# Patient Record
Sex: Female | Born: 1960 | Marital: Married | Smoking: Former smoker
Health system: Southern US, Community
[De-identification: ages and names within clinical notes are randomized; demographics above are authoritative.]

## PROBLEM LIST (undated history)

## (undated) DIAGNOSIS — E785 Hyperlipidemia, unspecified: Secondary | ICD-10-CM

## (undated) DIAGNOSIS — Z87442 Personal history of urinary calculi: Secondary | ICD-10-CM

## (undated) DIAGNOSIS — I10 Essential (primary) hypertension: Secondary | ICD-10-CM

## (undated) DIAGNOSIS — Z8679 Personal history of other diseases of the circulatory system: Secondary | ICD-10-CM

## (undated) DIAGNOSIS — R161 Splenomegaly, not elsewhere classified: Secondary | ICD-10-CM

## (undated) DIAGNOSIS — C4490 Unspecified malignant neoplasm of skin, unspecified: Secondary | ICD-10-CM

## (undated) DIAGNOSIS — E119 Type 2 diabetes mellitus without complications: Secondary | ICD-10-CM

## (undated) DIAGNOSIS — Z8669 Personal history of other diseases of the nervous system and sense organs: Secondary | ICD-10-CM

## (undated) HISTORY — DX: Splenomegaly, not elsewhere classified: R16.1

## (undated) HISTORY — PX: UPPER GASTROINTESTINAL ENDOSCOPY: SHX188

## (undated) HISTORY — PX: TUBAL LIGATION: SHX77

## (undated) HISTORY — DX: Type 2 diabetes mellitus without complications: E11.9

## (undated) HISTORY — PX: COLONOSCOPY: SHX174

## (undated) HISTORY — DX: Personal history of urinary calculi: Z87.442

## (undated) HISTORY — DX: Essential (primary) hypertension: I10

## (undated) HISTORY — PX: BREAST DUCTAL SYSTEM EXCISION: SHX5242

## (undated) HISTORY — PX: APPENDECTOMY: SHX54

## (undated) HISTORY — PX: SQUAMOUS CELL CARCINOMA EXCISION: SHX2433

## (undated) HISTORY — DX: Personal history of other diseases of the circulatory system: Z86.79

## (undated) HISTORY — DX: Personal history of other diseases of the nervous system and sense organs: Z86.69

## (undated) HISTORY — DX: Hyperlipidemia, unspecified: E78.5

## (undated) HISTORY — DX: Unspecified malignant neoplasm of skin, unspecified: C44.90

---

## 2013-10-31 DIAGNOSIS — I10 Essential (primary) hypertension: Secondary | ICD-10-CM | POA: Insufficient documentation

## 2020-08-02 ENCOUNTER — Telehealth: Payer: Self-pay | Admitting: Hematology and Oncology

## 2020-08-02 NOTE — Telephone Encounter (Signed)
Patient referred by Dr Ernestene Kiel for Neutropena/Thrombocyptopenia.  Appt made for 08/07/20 Consult 9:00 am - Labs 10:30 am (if needed)

## 2020-08-05 ENCOUNTER — Telehealth: Payer: Self-pay | Admitting: Oncology

## 2020-08-05 NOTE — Telephone Encounter (Signed)
Due to Dr Alvy Bimler being called to another location on 6/22, patient has been rescheduled to 6/27 Labs 1:45 pm - Consult w/Dr Bobby Rumpf 2:15 pm

## 2020-08-07 ENCOUNTER — Encounter: Payer: Self-pay | Admitting: Hematology and Oncology

## 2020-08-07 ENCOUNTER — Other Ambulatory Visit: Payer: Self-pay

## 2020-08-12 ENCOUNTER — Other Ambulatory Visit: Payer: Self-pay

## 2020-08-12 ENCOUNTER — Other Ambulatory Visit: Payer: Self-pay | Admitting: Oncology

## 2020-08-12 ENCOUNTER — Telehealth: Payer: Self-pay | Admitting: Oncology

## 2020-08-12 ENCOUNTER — Encounter: Payer: Self-pay | Admitting: Oncology

## 2020-08-12 ENCOUNTER — Inpatient Hospital Stay: Payer: Self-pay | Attending: Hematology and Oncology | Admitting: Oncology

## 2020-08-12 ENCOUNTER — Inpatient Hospital Stay: Payer: Self-pay

## 2020-08-12 DIAGNOSIS — Z79899 Other long term (current) drug therapy: Secondary | ICD-10-CM | POA: Insufficient documentation

## 2020-08-12 DIAGNOSIS — Z8249 Family history of ischemic heart disease and other diseases of the circulatory system: Secondary | ICD-10-CM

## 2020-08-12 DIAGNOSIS — M2559 Pain in other specified joint: Secondary | ICD-10-CM

## 2020-08-12 DIAGNOSIS — Z833 Family history of diabetes mellitus: Secondary | ICD-10-CM

## 2020-08-12 DIAGNOSIS — D72819 Decreased white blood cell count, unspecified: Secondary | ICD-10-CM | POA: Insufficient documentation

## 2020-08-12 DIAGNOSIS — R5383 Other fatigue: Secondary | ICD-10-CM

## 2020-08-12 DIAGNOSIS — Z806 Family history of leukemia: Secondary | ICD-10-CM | POA: Insufficient documentation

## 2020-08-12 DIAGNOSIS — R519 Headache, unspecified: Secondary | ICD-10-CM

## 2020-08-12 DIAGNOSIS — D696 Thrombocytopenia, unspecified: Secondary | ICD-10-CM | POA: Insufficient documentation

## 2020-08-12 DIAGNOSIS — R7401 Elevation of levels of liver transaminase levels: Secondary | ICD-10-CM | POA: Insufficient documentation

## 2020-08-12 DIAGNOSIS — R0602 Shortness of breath: Secondary | ICD-10-CM

## 2020-08-12 DIAGNOSIS — Z818 Family history of other mental and behavioral disorders: Secondary | ICD-10-CM

## 2020-08-12 DIAGNOSIS — Z8349 Family history of other endocrine, nutritional and metabolic diseases: Secondary | ICD-10-CM

## 2020-08-12 LAB — CBC AND DIFFERENTIAL
HCT: 41 (ref 36–46)
Hemoglobin: 13.8 (ref 12.0–16.0)
Neutrophils Absolute: 1.37
Platelets: 58 — AB (ref 150–399)
WBC: 2.4

## 2020-08-12 LAB — FOLATE: Folate: 11 ng/mL (ref >5.9)

## 2020-08-12 LAB — IRON AND TIBC
Iron: 115 ug/dL (ref 28–170)
Saturation Ratios: 29 % (ref 10.4–31.8)
TIBC: 392 ug/dL (ref 250–450)
UIBC: 277 ug/dL

## 2020-08-12 LAB — TSH: TSH: 1.018 u[IU]/mL (ref 0.350–4.500)

## 2020-08-12 LAB — VITAMIN B12: Vitamin B-12: 385 pg/mL (ref 180–914)

## 2020-08-12 LAB — FERRITIN: Ferritin: 32 ng/mL (ref 11–307)

## 2020-08-12 LAB — HEPATIC FUNCTION PANEL
ALT: 35 (ref 7–35)
AST: 45 — AB (ref 13–35)
Alkaline Phosphatase: 94 (ref 25–125)
Bilirubin, Total: 1.3

## 2020-08-12 LAB — COMPREHENSIVE METABOLIC PANEL
Albumin: 3.9 (ref 3.5–5.0)
Calcium: 8.7 (ref 8.7–10.7)

## 2020-08-12 LAB — CBC: RBC: 4.36 (ref 3.87–5.11)

## 2020-08-12 LAB — BASIC METABOLIC PANEL
BUN: 15 (ref 4–21)
CO2: 27 — AB (ref 13–22)
Chloride: 106 (ref 99–108)
Creatinine: 0.9 (ref 0.5–1.1)
Glucose: 102
Potassium: 4.1 (ref 3.4–5.3)
Sodium: 139 (ref 137–147)

## 2020-08-12 NOTE — Telephone Encounter (Signed)
Per 6/27 los next appt scheduled and given to patient.

## 2020-08-12 NOTE — Progress Notes (Signed)
[facility]  [address] [place],  [place]  [address] [phone]  Clinic Day:  08/12/2020  Referring physician: Ernestene Kiel, MD   HISTORY OF PRESENT ILLNESS:  The patient is a 60 y.o. female who I was asked to consult upon for neutropenia.  Recent labs showed a low white count of 1.9.  Her CBC also showed a low platelet count of 64.  These labs were checked in the [facility] emergency room after she presented there with supraventricular tachycardia.  When her CBC was rechecked at her primary care office a week later, her white cells and platelets remained low.  She recalls having night sweats a week and a half ago.  She denies having other B symptoms over these past months.  According to the patient, she never had been told before of having low blood counts.  Her cytopenias have her concerned as she had a half brother die from leukemia at age 19.  She also had a maternal aunt pass away from leukemia.   PAST MEDICAL HISTORY:   Past Medical History:  Diagnosis Date   Diabetes mellitus without complication (HCC)    History of Bell's palsy    History of PSVT (paroxysmal supraventricular tachycardia)    History of renal calculi    Hyperlipidemia    Hypertension    Skin cancer     PAST SURGICAL HISTORY:  Appendectomy, bilateral tubal ligation  CURRENT MEDICATIONS:   Current Outpatient Medications  Medication Sig Dispense Refill   albuterol (VENTOLIN HFA) 108 (90 Base) MCG/ACT inhaler SMARTSIG:1-2 Puff(s) By Mouth Every 4 Hours PRN     gabapentin (NEURONTIN) 100 MG capsule Take 100 mg by mouth 3 (three) times daily as needed.     latanoprost (XALATAN) 0.005 % ophthalmic solution 1 drop at bedtime.     losartan (COZAAR) 100 MG tablet Take 100 mg by mouth daily.     metFORMIN (GLUCOPHAGE-XR) 500 MG 24 hr tablet Take 1,000 mg by mouth daily.     metoprolol succinate (TOPROL-XL) 25 MG 24 hr tablet Take 25 mg by mouth daily.     telmisartan (MICARDIS)  80 MG tablet Take 80 mg by mouth daily.     traZODone (DESYREL) 50 MG tablet Take 50-100 mg by mouth at bedtime as needed.     No current facility-administered medications for this visit.    ALLERGIES:   Allergies  Allergen Reactions   Amlodipine    Benicar [Olmesartan]    Indapamide    Lisinopril     FAMILY HISTORY:   Family History  Problem Relation Age of Onset   Hypertension Mother    Congestive Heart Failure Mother    Chronic Renal Failure Mother    Congestive Heart Failure Father    Diabetes Father    Thyroid disease Father    Leukemia Half-Brother    Down syndrome Half-Brother    Thyroid disease Half-Sister    Leukemia Maternal Aunt     SOCIAL HISTORY:  The patient was born and raised in [place].  She lives in [place] with her husband of 80 years.  She has 3 children and 11 grandchildren.  She was a Lobbyist for 21 years.  She denies a history of alcohol use.  She smoked less than a pack of cigarettes daily for 6 years she has not smoked in 34 years.    REVIEW OF SYSTEMS:  Review of Systems  Constitutional:  Positive for fatigue. Negative for fever.  HENT:   Negative for hearing loss and sore throat.   Eyes:  Negative for eye problems.  Respiratory:  Positive for shortness of breath. Negative for chest tightness, cough and hemoptysis.   Cardiovascular:  Positive for palpitations. Negative for chest pain.  Gastrointestinal:  Negative for abdominal distention, abdominal pain, blood in stool, constipation, diarrhea, nausea and vomiting.  Endocrine: Negative for hot flashes.  Genitourinary:  Negative for difficulty urinating, dysuria, frequency, hematuria and nocturia.   Musculoskeletal:  Positive for arthralgias. Negative for back pain, gait problem and myalgias.  Skin: Negative.  Negative for itching and rash.       Easy bruising  Neurological:  Positive for headaches. Negative for dizziness, extremity weakness, gait problem,  light-headedness and numbness.  Hematological: Negative.   Psychiatric/Behavioral: Negative.  Negative for depression and suicidal ideas. The patient is not nervous/anxious.     PHYSICAL EXAM:  Blood pressure (!) 228/96, pulse (!) 52, temperature 98.4 F (36.9 C), resp. rate 16, height 5' 3.5" (1.613 m), weight 182 lb 14.4 oz (83 kg), SpO2 96 %. Wt Readings from Last 3 Encounters:  08/12/20 182 lb 14.4 oz (83 kg)   Body mass index is 31.89 kg/m. Performance status (ECOG): 1 - Symptomatic but completely ambulatory Physical Exam Constitutional:      Appearance: Normal appearance. She is not ill-appearing.  HENT:     Mouth/Throat:     Mouth: Mucous membranes are moist.     Pharynx: Oropharynx is clear. No oropharyngeal exudate or posterior oropharyngeal erythema.  Cardiovascular:     Rate and Rhythm: Normal rate and regular rhythm.     Heart sounds: No murmur heard.   No friction rub. No gallop.  Pulmonary:     Effort: Pulmonary effort is normal. No respiratory distress.     Breath sounds: Normal breath sounds. No wheezing, rhonchi or rales.  Chest:  Breasts:    Right: No axillary adenopathy or supraclavicular adenopathy.     Left: No axillary adenopathy or supraclavicular adenopathy.  Abdominal:     General: Bowel sounds are normal. There is no distension.     Palpations: Abdomen is soft. There is no mass.     Tenderness: There is no abdominal tenderness.  Musculoskeletal:        General: No swelling.     Right lower leg: No edema.     Left lower leg: No edema.  Lymphadenopathy:     Cervical: No cervical adenopathy.     Upper Body:     Right upper body: No supraclavicular or axillary adenopathy.     Left upper body: No supraclavicular or axillary adenopathy.     Lower Body: No right inguinal adenopathy. No left inguinal adenopathy.  Skin:    General: Skin is warm.     Coloration: Skin is not jaundiced.     Findings: No lesion or rash.  Neurological:     General: No  focal deficit present.     Mental Status: She is alert and oriented to person, place, and time. Mental status is at baseline.     Cranial Nerves: Cranial nerves are intact.  Psychiatric:        Mood and Affect: Mood normal.        Behavior: Behavior normal.        Thought Content: Thought content normal.   LABS:    CMP Latest Ref Rng & Units 08/12/2020  BUN 4 - 21 15  Creatinine 0.5 - 1.1 0.9  Sodium 137 -  147 139  Potassium 3.4 - 5.3 4.1  Chloride 99 - 108 106  CO2 13 - 22 27(A)  Calcium 8.7 - 10.7 8.7  Alkaline Phos 25 - 125 94  AST 13 - 35 45(A)  ALT 7 - 35 35    ASSESSMENT & PLAN:  A 60 y.o. female who I was asked to consult upon for leukopenia and thrombocytopenia.   In clinic today, I will check her iron, B12 and folate levels to ensure her cytopenias are not due to any nutritional deficiencies.  Her TSH will be checked to ensure severe thyroid disease is not factoring into her cytopenias.  It is not uncommon to see cytopenias in the setting of liver disease.  As she does have an elevated AST level, I will have her undergo a CT scan of her abdomen/pelvis to ensure no underlying hepatosplenic disease is present.  I will see her back in 3 weeks to go over her labs and scans, as well as their implications as it pertains to her cytopenias.  The patient understands all the plans discussed today and is in agreement with them  I do appreciate Ernestene Kiel, MD for his new consult.   Almee Pelphrey Macarthur Critchley, MD

## 2020-08-12 NOTE — Telephone Encounter (Signed)
Per 6/27 los next appt scheduled and given to patient

## 2020-08-13 NOTE — Progress Notes (Signed)
Patient will not have insurance until August of this year. She was denied [place]'s financial assistance months back, after checking with her and Samantha Crimes financial counselor she still would not be eligible. She is ok with getting her scans now and stated that she would pay on them the best she could.

## 2020-08-21 DIAGNOSIS — D72819 Decreased white blood cell count, unspecified: Secondary | ICD-10-CM | POA: Insufficient documentation

## 2020-08-21 DIAGNOSIS — D696 Thrombocytopenia, unspecified: Secondary | ICD-10-CM | POA: Insufficient documentation

## 2020-08-29 NOTE — Progress Notes (Signed)
[facility]  [address] [place],  [place]  [address] [phone]  Clinic Day:  09/03/2020  Referring physician: Ernestene Kiel, MD  This document serves as a record of services personally performed by Marice Potter, MD. It was created on their behalf by [facility] E, a trained medical scribe. The creation of this record is based on the scribe's personal observations and the provider's statements to them.  HISTORY OF PRESENT ILLNESS:  The patient is a 60 y.o. female who I recently began seeing for neutropenia and thrombocytopenia.  As recent labs also showed elevated liver enzymes, the patient recently underwent CT scan for further evaluation.  She comes in today to go over CT scan images and their implications.  Since her last visit, the patient has been doing fairly well.  She denies having any spontaneous infections which concern her for worsening neutropenia.  She also denies having any spontaneous bleeding/ bruising issues which concern her for worsening thrombocytopenia.  PHYSICAL EXAM:  Blood pressure (!) 183/81, pulse 68, temperature 98.3 F (36.8 C), temperature source Oral, resp. rate 18, height 5' 3.5" (1.613 m), weight 180 lb 1.6 oz (81.7 kg), SpO2 95 %. Wt Readings from Last 3 Encounters:  09/03/20 180 lb 1.6 oz (81.7 kg)  08/12/20 182 lb 14.4 oz (83 kg)   Body mass index is 31.4 kg/m. Performance status (ECOG): 1 - Symptomatic but completely ambulatory Physical Exam Constitutional:      Appearance: Normal appearance. She is not ill-appearing.  HENT:     Mouth/Throat:     Mouth: Mucous membranes are moist.     Pharynx: Oropharynx is clear. No oropharyngeal exudate or posterior oropharyngeal erythema.  Cardiovascular:     Rate and Rhythm: Normal rate and regular rhythm.     Heart sounds: No murmur heard.   No friction rub. No gallop.  Pulmonary:     Effort: Pulmonary effort is normal. No respiratory distress.     Breath  sounds: Normal breath sounds. No wheezing, rhonchi or rales.  Chest:  Breasts:    Right: No axillary adenopathy or supraclavicular adenopathy.     Left: No axillary adenopathy or supraclavicular adenopathy.  Abdominal:     General: Bowel sounds are normal. There is no distension.     Palpations: Abdomen is soft. There is no mass.     Tenderness: There is no abdominal tenderness.  Musculoskeletal:        General: No swelling.     Right lower leg: No edema.     Left lower leg: No edema.  Lymphadenopathy:     Cervical: No cervical adenopathy.     Upper Body:     Right upper body: No supraclavicular or axillary adenopathy.     Left upper body: No supraclavicular or axillary adenopathy.     Lower Body: No right inguinal adenopathy. No left inguinal adenopathy.  Skin:    General: Skin is warm.     Coloration: Skin is not jaundiced.     Findings: No lesion or rash.  Neurological:     General: No focal deficit present.     Mental Status: She is alert and oriented to person, place, and time. Mental status is at baseline.     Cranial Nerves: Cranial nerves are intact.  Psychiatric:        Mood and Affect: Mood normal.        Behavior: Behavior normal.        Thought Content: Thought content  normal.   SCANS:  CT abdomen and pelvis from 09/02/2020 revealed the following: FINDINGS: Lower Chest: No acute findings.  Hepatobiliary: Hepatic cirrhosis is demonstrated. Recanalization of paraumbilical veins is consistent with portal venous hypertension. No hepatic masses are identified. Gallbladder is unremarkable. No evidence of biliary ductal dilatation.  Pancreas: A multilocular cystic lesion is seen in the pancreatic uncinate process which measures 2.0 x 1.9 cm on image 44/601. A 9 mm cystic lesion is seen in the pancreatic tail on image 33/2. No evidence of pancreatic ductal dilatation.  Spleen: Marked splenomegaly is demonstrated with length of approximately 19 cm. No splenic masses  identified.  Adrenals/Urinary Tract: No masses identified. No evidence of ureteral calculi or hydronephrosis.  Stomach/Bowel: No evidence of obstruction, inflammatory process or abnormal fluid collections. A 1.8 cm soft tissue density seen along the posterior wall of the ascending colon on image 52/2, suspicious for a colonic polyp.  Vascular/Lymphatic: No pathologically enlarged lymph nodes. No acute vascular findings. Esophageal varices are seen, consistent with portal venous hypertension.  Reproductive: 1.7 cm right-sided uterine fibroid noted. Adnexal regions are unremarkable.  Other:  No evidence of ascites.  Musculoskeletal:  No suspicious bone lesions identified.  IMPRESSION: Hepatic cirrhosis and findings of portal venous hypertension including esophageal varices. No evidence of hepatic neoplasm.  Marked splenomegaly. No evidence of ascites.  Small uterine fibroid.  Cystic lesions in the pancreatic uncinate process and tail, largest measuring 2.0 cm. Recommend abdomen MRI without and with contrast for further characterization.  1.8 cm soft tissue density along the posterior wall of the ascending colon, suspicious for colonic polyp. Colonoscopy should be considered for further evaluation.   LABS:    CMP Latest Ref Rng & Units 08/12/2020  BUN 4 - 21 15  Creatinine 0.5 - 1.1 0.9  Sodium 137 - 147 139  Potassium 3.4 - 5.3 4.1  Chloride 99 - 108 106  CO2 13 - 22 27(A)  Calcium 8.7 - 10.7 8.7  Alkaline Phos 25 - 125 94  AST 13 - 35 45(A)  ALT 7 - 35 35    ASSESSMENT & PLAN:  A 60 y.o. female with leukopenia and thrombocytopenia.   In clinic today, I went over all of her CT scan images with her, for which she could see that she does have cirrhosis, as well as secondary splenomegaly.  The patient knew about her history of fatty liver disease.  This is likely the reason behind her cirrhosis.  Her secondary splenomegaly is likely the major reason behind her  cytopenias.  I also showed the patient images that showed a 2 pancreatic lesions, including 1 at the head and another at the tail of her pancreas.  Her scans also showed a lesion in her right colon, which may be a polyp.  Based upon these findings and her baseline liver disease, I will have her see gastroenterology in [place] to undergo both an ERCP and a colonoscopy, as well as to evaluate her cirrhosis.  I will see her back in 6 weeks for repeat clinical assessment.  Hopefully, by then, her GI workup will have been completed to where additional plans can be formulated.     The patient understands all the plans discussed today and is in agreement with them  I, Rita Ohara, am acting as scribe for Marice Potter, MD    I have reviewed this report as typed by the medical scribe, and it is complete and accurate.  Makara Lanzo Macarthur Critchley, MD

## 2020-09-03 ENCOUNTER — Inpatient Hospital Stay: Payer: Self-pay | Attending: Hematology and Oncology | Admitting: Oncology

## 2020-09-03 ENCOUNTER — Telehealth: Payer: Self-pay | Admitting: Oncology

## 2020-09-03 ENCOUNTER — Other Ambulatory Visit: Payer: Self-pay | Admitting: Oncology

## 2020-09-03 ENCOUNTER — Other Ambulatory Visit: Payer: Self-pay

## 2020-09-03 VITALS — BP 183/81 | HR 68 | Temp 98.3°F | Resp 18 | Ht 63.5 in | Wt 180.1 lb

## 2020-09-03 DIAGNOSIS — D72819 Decreased white blood cell count, unspecified: Secondary | ICD-10-CM

## 2020-09-03 DIAGNOSIS — D696 Thrombocytopenia, unspecified: Secondary | ICD-10-CM

## 2020-09-03 DIAGNOSIS — D708 Other neutropenia: Secondary | ICD-10-CM

## 2020-09-03 NOTE — Telephone Encounter (Signed)
Per 7/19 los next appt scheduled and confirmed by patient

## 2020-09-10 ENCOUNTER — Encounter: Payer: Self-pay | Admitting: Internal Medicine

## 2020-09-11 ENCOUNTER — Encounter: Payer: Self-pay | Admitting: Oncology

## 2020-09-24 ENCOUNTER — Telehealth: Payer: Self-pay | Admitting: Oncology

## 2020-09-24 NOTE — Telephone Encounter (Signed)
Per 8/8 Staff Msg, patient rescheduled to 10/25/20 Labs 9:00 am - Follow Up 9:30 am.  Patient is aware of the Appt Change

## 2020-10-15 ENCOUNTER — Other Ambulatory Visit: Payer: Self-pay

## 2020-10-15 ENCOUNTER — Ambulatory Visit: Payer: Self-pay | Admitting: Oncology

## 2020-10-16 ENCOUNTER — Other Ambulatory Visit (INDEPENDENT_AMBULATORY_CARE_PROVIDER_SITE_OTHER): Payer: Self-pay

## 2020-10-16 ENCOUNTER — Ambulatory Visit (INDEPENDENT_AMBULATORY_CARE_PROVIDER_SITE_OTHER): Payer: Self-pay | Admitting: Internal Medicine

## 2020-10-16 ENCOUNTER — Encounter: Payer: Self-pay | Admitting: Internal Medicine

## 2020-10-16 VITALS — BP 132/76 | HR 84 | Ht 64.0 in | Wt 178.0 lb

## 2020-10-16 DIAGNOSIS — K7460 Unspecified cirrhosis of liver: Secondary | ICD-10-CM

## 2020-10-16 DIAGNOSIS — R935 Abnormal findings on diagnostic imaging of other abdominal regions, including retroperitoneum: Secondary | ICD-10-CM

## 2020-10-16 DIAGNOSIS — R7989 Other specified abnormal findings of blood chemistry: Secondary | ICD-10-CM

## 2020-10-16 DIAGNOSIS — R945 Abnormal results of liver function studies: Secondary | ICD-10-CM

## 2020-10-16 DIAGNOSIS — K862 Cyst of pancreas: Secondary | ICD-10-CM

## 2020-10-16 LAB — CBC WITH DIFFERENTIAL/PLATELET
Basophils Absolute: 0 10*3/uL (ref 0.0–0.1)
Basophils Relative: 0.9 % (ref 0.0–3.0)
Eosinophils Absolute: 0 10*3/uL (ref 0.0–0.7)
Eosinophils Relative: 2.3 % (ref 0.0–5.0)
HCT: 41.4 % (ref 36.0–46.0)
Hemoglobin: 13.7 g/dL (ref 12.0–15.0)
Lymphocytes Relative: 31.9 % (ref 12.0–46.0)
Lymphs Abs: 0.6 10*3/uL — ABNORMAL LOW (ref 0.7–4.0)
MCHC: 33.1 g/dL (ref 30.0–36.0)
MCV: 93.9 fl (ref 78.0–100.0)
Monocytes Absolute: 0.2 10*3/uL (ref 0.1–1.0)
Monocytes Relative: 8.1 % (ref 3.0–12.0)
Neutro Abs: 1.1 10*3/uL — ABNORMAL LOW (ref 1.4–7.7)
Neutrophils Relative %: 56.8 % (ref 43.0–77.0)
Platelets: 48 10*3/uL — CL (ref 150.0–400.0)
RBC: 4.41 Mil/uL (ref 3.87–5.11)
RDW: 14.7 % (ref 11.5–15.5)
WBC: 1.9 10*3/uL — CL (ref 4.0–10.5)

## 2020-10-16 LAB — BASIC METABOLIC PANEL
BUN: 16 mg/dL (ref 6–23)
CO2: 24 mEq/L (ref 19–32)
Calcium: 9.3 mg/dL (ref 8.4–10.5)
Chloride: 107 mEq/L (ref 96–112)
Creatinine, Ser: 0.96 mg/dL (ref 0.40–1.20)
GFR: 63.62 mL/min (ref >60.00)
Glucose, Bld: 110 mg/dL — ABNORMAL HIGH (ref 70–99)
Potassium: 4.3 mEq/L (ref 3.5–5.1)
Sodium: 139 mEq/L (ref 135–145)

## 2020-10-16 LAB — HEPATIC FUNCTION PANEL
ALT: 45 U/L — ABNORMAL HIGH (ref 0–35)
AST: 43 U/L — ABNORMAL HIGH (ref 0–37)
Albumin: 3.7 g/dL (ref 3.5–5.2)
Alkaline Phosphatase: 80 U/L (ref 39–117)
Bilirubin, Direct: 0.1 mg/dL (ref 0.0–0.3)
Total Bilirubin: 0.7 mg/dL (ref 0.2–1.2)
Total Protein: 7 g/dL (ref 6.0–8.3)

## 2020-10-16 LAB — PROTIME-INR
INR: 1.2 ratio — ABNORMAL HIGH (ref 0.8–1.0)
Prothrombin Time: 12.6 s (ref 9.6–13.1)

## 2020-10-16 LAB — TSH: TSH: 1.77 u[IU]/mL (ref 0.35–5.50)

## 2020-10-16 MED ORDER — SUTAB 1479-225-188 MG PO TABS: 1.0000 | ORAL_TABLET | Freq: Once | ORAL | 0 refills | Status: AC

## 2020-10-16 NOTE — Patient Instructions (Addendum)
If you are age 62 or older, your body mass index should be between 23-30. Your Body mass index is 30.55 kg/m. If this is out of the aforementioned range listed, please consider follow up with your Primary Care Provider.  If you are age 22 or younger, your body mass index should be between 19-25. Your Body mass index is 30.55 kg/m. If this is out of the aformentioned range listed, please consider follow up with your Primary Care Provider.   __________________________________________________________  The [facility] providers would like to encourage you to use [facility] to communicate with providers for non-urgent requests or questions.  Due to long hold times on the telephone, sending your provider a message by [facility] may be a faster and more efficient way to get a response.  Please allow 48 business hours for a response.  Please remember that this is for non-urgent requests.   You have been scheduled for an endoscopy and colonoscopy. Please follow the written instructions given to you at your visit today. Please pick up your prep supplies at the pharmacy within the next 1-3 days. If you use inhalers (even only as needed), please bring them with you on the day of your procedure.   Your provider has requested that you go to the basement level for lab work before leaving today. Press "B" on the elevator. The lab is located at the first door on the left as you exit the elevator.

## 2020-10-16 NOTE — Progress Notes (Signed)
HISTORY OF PRESENT ILLNESS:  Sheri Ramirez is a 60 y.o. female, retired Music therapist, with a history of diabetes mellitus, hypertension, and PSVT who was sent today by her primary care provider regarding newly diagnosed hepatic cirrhosis and abnormal findings on CT imaging.  Patient is accompanied today by her husband.  Patient tells me that she was in her usual state of health until June 2022 when she presented to her local hospital in [place] with SVT.  She was successfully treated with adenosine.  At that time blood work revealed several abnormalities including thrombocytopenia with a platelet count of 58,000.  White blood cell count 2.4.  Liver tests were normal except for AST of 45.  She was subsequently referred to a hematologist, Sheri Ramirez.  As part of that evaluation she underwent CT imaging.  She was found to have hepatic cirrhosis with portal hypertension eluding esophageal varices.  No evidence for hepatic neoplasm.  There was marked splenomegaly.  No ascites.  There are also cystic lesions in the pancreatic uncinate process and tail, the largest of which measured 2 cm.  For this MRI was recommended.  Finally, a 1.8 cm soft tissue density along the posterior wall of the ascending colon which was suspicious for a colonic polyp.  Colonoscopy recommended.  Patient denies a personal history of liver disease.  She denies prior transfusion, tattoos, or IV drugs.  She does not use alcohol.  She has been chronically overweight at about her current BMI of 30.  Her father had cirrhosis secondary to hepatitis from a blood transfusion.  Her granddaughter had a liver transplant due to biliary atresia.  The patient herself had a mild to moderate COVID infection in January 2022.  She has been diabetic since 2015.  Her son has Crohn's disease.  REVIEW OF SYSTEMS:  All non-GI ROS negative unless otherwise stated in the HPI except for back pain  Past Medical History:  Diagnosis Date   Diabetes  mellitus without complication ([place])    History of Bell's palsy    History of PSVT (paroxysmal supraventricular tachycardia)    History of renal calculi    Hyperlipidemia    Hypertension    Skin cancer    Splenomegaly     Past Surgical History:  Procedure Laterality Date   APPENDECTOMY     BREAST DUCTAL SYSTEM EXCISION     SQUAMOUS CELL CARCINOMA EXCISION     nose   TUBAL LIGATION Bilateral     Social History Sheri Ramirez  reports that she has quit smoking. Her smoking use included cigarettes. She has never used smokeless tobacco. She reports that she does not currently use alcohol. She reports that she does not currently use drugs.  family history includes Chronic Renal Failure in her mother; Congestive Heart Failure in her father and mother; Crohn's disease in her child; Diabetes in her father; Down syndrome in her half-brother; Hypertension in her mother; Leukemia in her half-brother and maternal aunt; Thyroid disease in her father and half-sister.  Allergies  Allergen Reactions   Amlodipine    Benicar [Olmesartan]    Indapamide    Lisinopril        PHYSICAL EXAMINATION: Vital signs: BP 132/76   Pulse 84   Ht '5\' 4"'$  (1.626 m)   Wt 178 lb (80.7 kg)   BMI 30.55 kg/m   Constitutional: generally well-appearing, no acute distress Psychiatric: alert and oriented x3, cooperative Eyes: extraocular movements intact, anicteric, conjunctiva pink Mouth: oral pharynx moist, no lesions.  Normal tongue hue Neck: supple no lymphadenopathy Cardiovascular: heart regular rate and rhythm, no murmur Lungs: clear to auscultation bilaterally Abdomen: soft, nontender, nondistended, no obvious ascites, no peritoneal signs, normal bowel sounds, palpable spleen tip Rectal: Deferred until colonoscopy Extremities: no clubbing, cyanosis, or lower extremity edema bilaterally Skin: no lesions on visible extremities Neuro: No focal deficits. No asterixis.     ASSESSMENT:  1.  Hepatic  cirrhosis with portal hypertension.  Etiology uncertain.  Compensated. 2.  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  Possible colonic polyp on CT as described 4.  General medical problems   PLAN:  1.  Multiple laboratories today to assess for infectious and noninfectious causes for hepatic cirrhosis. 2.  Schedule upper endoscopy to rule out esophageal varices.  The patient is high risk given her comorbidities.The nature of the procedure, as well as the risks, benefits, and alternatives were carefully and thoroughly reviewed with the patient. Ample time for discussion and questions allowed. The patient understood, was satisfied, and agreed to proceed.  3.  Schedule colonoscopy to clarify abnormality on CT and provide colon cancer screening.  The patient is high risk given her comorbidities.The nature of the procedure, as well as the risks, benefits, and alternatives were carefully and thoroughly reviewed with the patient. Ample time for discussion and questions allowed. The patient understood, was satisfied, and agreed to proceed.  4.  MRI versus EUS at some point regarding pancreatic cystic lesion.  We will confer with EUS colleagues at some point after all of the above completed. 5.  Hold diabetic medications the day of the procedure to avoid unwanted hypoglycemia A total time of 60 minutes was spent preparing to see the patient, reviewing laboratories and x-rays, obtaining comprehensive history and performing comprehensive physical examination.  Counseling the patient and her husband regarding the above listed issues.  Ordering medications and advanced endoscopic procedures.  Finally, documenting clinical information in the health record

## 2020-10-18 LAB — HEPATITIS B SURFACE ANTIBODY,QUALITATIVE: Hep B S Ab: NONREACTIVE

## 2020-10-18 LAB — ANA: Anti Nuclear Antibody (ANA): NEGATIVE

## 2020-10-18 LAB — ALPHA-1-ANTITRYPSIN: A-1 Antitrypsin, Ser: 153 mg/dL (ref 83–199)

## 2020-10-18 LAB — HEPATITIS C ANTIBODY
Hepatitis C Ab: NONREACTIVE
SIGNAL TO CUT-OFF: 0.02 (ref <1.00)

## 2020-10-18 LAB — HEPATITIS B SURFACE ANTIGEN: Hepatitis B Surface Ag: NONREACTIVE

## 2020-10-18 LAB — ANTI-SMOOTH MUSCLE ANTIBODY, IGG: Actin (Smooth Muscle) Antibody (IGG): <20 U (ref <20)

## 2020-10-18 LAB — CERULOPLASMIN: Ceruloplasmin: 23 mg/dL (ref 18–53)

## 2020-10-18 LAB — MITOCHONDRIAL ANTIBODIES: Mitochondrial M2 Ab, IgG: <=20 U

## 2020-10-22 ENCOUNTER — Other Ambulatory Visit: Payer: Self-pay

## 2020-10-25 ENCOUNTER — Inpatient Hospital Stay: Payer: Self-pay | Attending: Hematology and Oncology

## 2020-10-25 ENCOUNTER — Inpatient Hospital Stay: Payer: Self-pay | Admitting: Oncology

## 2020-10-25 ENCOUNTER — Ambulatory Visit (INDEPENDENT_AMBULATORY_CARE_PROVIDER_SITE_OTHER): Payer: Self-pay | Admitting: Internal Medicine

## 2020-10-25 DIAGNOSIS — Z23 Encounter for immunization: Secondary | ICD-10-CM

## 2020-11-29 ENCOUNTER — Ambulatory Visit (INDEPENDENT_AMBULATORY_CARE_PROVIDER_SITE_OTHER): Payer: Self-pay | Admitting: Internal Medicine

## 2020-11-29 DIAGNOSIS — Z23 Encounter for immunization: Secondary | ICD-10-CM

## 2020-12-23 ENCOUNTER — Encounter: Payer: Self-pay | Admitting: Internal Medicine

## 2020-12-23 ENCOUNTER — Ambulatory Visit (AMBULATORY_SURGERY_CENTER): Payer: Self-pay | Admitting: Internal Medicine

## 2020-12-23 ENCOUNTER — Other Ambulatory Visit: Payer: Self-pay

## 2020-12-23 VITALS — BP 162/72 | HR 52 | Temp 97.7°F | Resp 20 | Ht 64.0 in | Wt 178.0 lb

## 2020-12-23 DIAGNOSIS — K7460 Unspecified cirrhosis of liver: Secondary | ICD-10-CM

## 2020-12-23 DIAGNOSIS — K3189 Other diseases of stomach and duodenum: Secondary | ICD-10-CM

## 2020-12-23 DIAGNOSIS — D124 Benign neoplasm of descending colon: Secondary | ICD-10-CM

## 2020-12-23 DIAGNOSIS — I8500 Esophageal varices without bleeding: Secondary | ICD-10-CM

## 2020-12-23 DIAGNOSIS — D122 Benign neoplasm of ascending colon: Secondary | ICD-10-CM

## 2020-12-23 DIAGNOSIS — D123 Benign neoplasm of transverse colon: Secondary | ICD-10-CM

## 2020-12-23 DIAGNOSIS — K766 Portal hypertension: Secondary | ICD-10-CM

## 2020-12-23 DIAGNOSIS — R935 Abnormal findings on diagnostic imaging of other abdominal regions, including retroperitoneum: Secondary | ICD-10-CM

## 2020-12-23 MED ORDER — SODIUM CHLORIDE 0.9 % IV SOLN: 500.0000 mL | Freq: Once | INTRAVENOUS | Status: DC

## 2020-12-23 NOTE — Progress Notes (Signed)
HISTORY OF PRESENT ILLNESS:  Sheri Ramirez is a 60 y.o. female who was evaluated in the office as a new patient October 16, 2020 regarding newly diagnosed hepatic cirrhosis with portal hypertension and abnormal CT imaging..  Been no interval clinical changes.  She is now for colonoscopy and upper endoscopy  REVIEW OF SYSTEMS:  All non-GI ROS negative. Past Medical History:  Diagnosis Date   Diabetes mellitus without complication ([place])    History of Bell's palsy    History of PSVT (paroxysmal supraventricular tachycardia)    History of renal calculi    Hyperlipidemia    Hypertension    Skin cancer    Splenomegaly     Past Surgical History:  Procedure Laterality Date   APPENDECTOMY     BREAST DUCTAL SYSTEM EXCISION     COLONOSCOPY     SQUAMOUS CELL CARCINOMA EXCISION     nose   TUBAL LIGATION Bilateral    UPPER GASTROINTESTINAL ENDOSCOPY      Social History Sheri Ramirez  reports that she quit smoking about 34 years ago. Her smoking use included cigarettes. She has never used smokeless tobacco. She reports that she does not currently use alcohol. She reports that she does not currently use drugs.  family history includes Chronic Renal Failure in her mother; Congestive Heart Failure in her father and mother; Crohn's disease in her child; Diabetes in her father; Down syndrome in her half-brother; Hypertension in her mother; Leukemia in her half-brother and maternal aunt; Thyroid disease in her father and half-sister.  Allergies  Allergen Reactions   Amlodipine    Benicar [Olmesartan]    Indapamide    Lisinopril        PHYSICAL EXAMINATION:  Vital signs: BP (!) 197/79   Pulse (!) 59   Temp 97.7 F (36.5 C) (Temporal)   Resp 17   Ht 5\' 4"  (1.626 m)   Wt 178 lb (80.7 kg)   SpO2 99%   BMI 30.55 kg/m  General: Well-developed, well-nourished, no acute distress HEENT: Sclerae are anicteric, conjunctiva pink. Oral mucosa intact Lungs: Clear Heart: Regular Abdomen:  soft, nontender, nondistended, no obvious ascites, no peritoneal signs, normal bowel sounds. No organomegaly. Extremities: No edema Psychiatric: alert and oriented x3. Cooperative     ASSESSMENT:   1.  Hepatic cirrhosis with portal hypertension.  Etiology uncertain.  Compensated. 2.  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  Possible colonic polyp on CT as described 4.  General medical problems     PLAN:   1.  Multiple laboratories today to assess for infectious and noninfectious causes for hepatic cirrhosis. 2.  Schedule upper endoscopy to rule out esophageal varices.  The patient is high risk given her comorbidities.The nature of the procedure, as well as the risks, benefits, and alternatives were carefully and thoroughly reviewed with the patient. Ample time for discussion and questions allowed. The patient understood, was satisfied, and agreed to proceed.  3.  Schedule colonoscopy to clarify abnormality on CT and provide colon cancer screening.  The patient is high risk given her comorbidities.The nature of the procedure, as well as the risks, benefits, and alternatives were carefully and thoroughly reviewed with the patient. Ample time for discussion and questions allowed. The patient understood, was satisfied, and agreed to proceed.  4.  MRI versus EUS at some point regarding pancreatic cystic lesion.  We will confer with EUS colleagues at some point after all of the above completed.

## 2020-12-23 NOTE — Progress Notes (Signed)
To PACU, VSS. Report to [email or web address] 

## 2020-12-23 NOTE — Op Note (Signed)
[facility] Patient Name: Sheri Ramirez Procedure Date: 12/23/2020 2:12 PM MRN: [id number] Endoscopist: Docia Chuck. Henrene Pastor , MD Age: 59 Referring MD:  Date of Birth: 05/27/1960 Gender: Female Account #: [id number] Procedure:                Upper GI endoscopy Indications:              Cirrhosis with suspected esophageal varices Medicines:                Monitored Anesthesia Care Procedure:                Pre-Anesthesia Assessment:                           - Prior to the procedure, a History and Physical                            was performed, and patient medications and                            allergies were reviewed. The patient's tolerance of                            previous anesthesia was also reviewed. The risks                            and benefits of the procedure and the sedation                            options and risks were discussed with the patient.                            All questions were answered, and informed consent                            was obtained. Prior Anticoagulants: The patient has                            taken no previous anticoagulant or antiplatelet                            agents. ASA Grade Assessment: III - A patient with                            severe systemic disease. After reviewing the risks                            and benefits, the patient was deemed in                            satisfactory condition to undergo the procedure.                           After obtaining informed consent, the endoscope was  passed under direct vision. Throughout the                            procedure, the patient's blood pressure, pulse, and                            oxygen saturations were monitored continuously. The                            GIF [id number] was introduced through the                            mouth, and advanced to the second part of duodenum.                            The upper  GI endoscopy was accomplished without                            difficulty. The patient tolerated the procedure                            well. Scope In: Scope Out: Findings:                 The esophagus revealed 3 columns of small varices                            in the distal esophagus. No stigmata.                           The stomach revealed portal hypertensive                            gastropathy, moderate, with scattered hematin. No                            proximal varices.                           The examined duodenum was normal.                           The cardia and gastric fundus were normal on                            retroflexion. Complications:            No immediate complications. Estimated Blood Loss:     Estimated blood loss: none. Impression:               1. Small esophageal varices without stigmata                           2. Mild to moderate portal hypertensive gastropathy                           3. Otherwise normal EGD Recommendation:  1. Schedule MRI of the pancreas, with and without                            contrast, "further evaluate pancreatic lesion seen                            on CT".                           2. Routine office visit with Dr. Henrene Pastor after the                            above MRI completed                           3. Repeat EGD to reassess varices in 1 year.                           4. Ongoing general medical care with PCP and other                            specialists Lebron Nauert N. Henrene Pastor, MD 12/23/2020 3:25:50 PM This report has been signed electronically.

## 2020-12-23 NOTE — Progress Notes (Signed)
[facility] 

## 2020-12-23 NOTE — Op Note (Signed)
[facility] Patient Name: Sheri Ramirez Procedure Date: 12/23/2020 2:18 PM MRN: [id number] Endoscopist: Docia Chuck. Henrene Pastor , MD Age: 60 Referring MD:  Date of Birth: November 10, 1960 Gender: Female Account #: [id number] Procedure:                Colonoscopy with cold snare polypectomy x 2; with                            EMR; with submucosal injct Indications:              Abnormal CT of the GI tract (1.8 cm right colon                            polyp). This is her first colonoscopy Medicines:                Monitored Anesthesia Care Procedure:                Pre-Anesthesia Assessment:                           - Prior to the procedure, a History and Physical                            was performed, and patient medications and                            allergies were reviewed. The patient's tolerance of                            previous anesthesia was also reviewed. The risks                            and benefits of the procedure and the sedation                            options and risks were discussed with the patient.                            All questions were answered, and informed consent                            was obtained. Prior Anticoagulants: The patient has                            taken no previous anticoagulant or antiplatelet                            agents. ASA Grade Assessment: III - A patient with                            severe systemic disease. After reviewing the risks                            and benefits, the patient was deemed in  satisfactory condition to undergo the procedure.                           After obtaining informed consent, the colonoscope                            was passed under direct vision. Throughout the                            procedure, the patient's blood pressure, pulse, and                            oxygen saturations were monitored continuously. The                            CF  [id number] #[id number] was introduced through the anus                            and advanced to the the cecum, identified by                            appendiceal orifice and ileocecal valve. The                            ileocecal valve, appendiceal orifice, and rectum                            were photographed. The quality of the bowel                            preparation was excellent. The colonoscopy was                            performed without difficulty. The patient tolerated                            the procedure well. The bowel preparation used was                            SUPREP via split dose instruction. Scope In: 2:34:53 PM Scope Out: 3:00:29 PM Scope Withdrawal Time: 0 hours 23 minutes 52 seconds  Total Procedure Duration: 0 hours 25 minutes 36 seconds  Findings:                 A 20 mm polyp was found in the proximal ascending                            colon. The polyp was semi-pedunculated. The polyp                            was removed with a saline injection-lift technique                            using a hot snare. Resection and retrieval  were                            complete. Area was tattooed with an injection of                            Spot (carbon black).                           Two polyps were found in the descending colon and                            ascending colon. The polyps were 5 mm in size.                            These polyps were removed with a cold snare.                            Resection and retrieval were complete.                           The exam was otherwise without abnormality on                            direct and retroflexion views. Complications:            No immediate complications. Estimated blood loss:                            None. Estimated Blood Loss:     Estimated blood loss: none. Impression:               - One 20 mm polyp in the proximal ascending colon,                            removed using  injection-lift and a hot snare.                            Resected and retrieved. Tattooed.                           - Two 5 mm polyps in the descending colon and in                            the ascending colon, removed with a cold snare.                            Resected and retrieved.                           - The examination was otherwise normal on direct                            and retroflexion views. Recommendation:           - Repeat colonoscopy date to be determined  after                            pending pathology results are reviewed for                            surveillance.                           - Patient has a contact number available for                            emergencies. The signs and symptoms of potential                            delayed complications were discussed with the                            patient. Return to normal activities tomorrow.                            Written discharge instructions were provided to the                            patient.                           - Resume previous diet.                           - Continue present medications.                           - Await pathology results. Docia Chuck. Henrene Pastor, MD 12/23/2020 3:20:08 PM This report has been signed electronically.

## 2020-12-23 NOTE — Progress Notes (Signed)
Office will schedule MRI.

## 2020-12-23 NOTE — Patient Instructions (Signed)
Discharge instructions given. Handout on polyps. Resume previous medications. YOU HAD AN ENDOSCOPIC PROCEDURE TODAY AT [facility]:   Refer to the procedure report that was given to you for any specific questions about what was found during the examination.  If the procedure report does not answer your questions, please call your gastroenterologist to clarify.  If you requested that your care partner not be given the details of your procedure findings, then the procedure report has been included in a sealed envelope for you to review at your convenience later.  YOU SHOULD EXPECT: Some feelings of bloating in the abdomen. Passage of more gas than usual.  Walking can help get rid of the air that was put into your GI tract during the procedure and reduce the bloating. If you had a lower endoscopy (such as a colonoscopy or flexible sigmoidoscopy) you may notice spotting of blood in your stool or on the toilet paper. If you underwent a bowel prep for your procedure, you may not have a normal bowel movement for a few days.  Please Note:  You might notice some irritation and congestion in your nose or some drainage.  This is from the oxygen used during your procedure.  There is no need for concern and it should clear up in a day or so.  SYMPTOMS TO REPORT IMMEDIATELY:  Following lower endoscopy (colonoscopy or flexible sigmoidoscopy):  Excessive amounts of blood in the stool  Significant tenderness or worsening of abdominal pains  Swelling of the abdomen that is new, acute  Fever of 100F or higher  Following upper endoscopy (EGD)  Vomiting of blood or coffee ground material  New chest pain or pain under the shoulder blades  Painful or persistently difficult swallowing  New shortness of breath  Fever of 100F or higher  Black, tarry-looking stools  For urgent or emergent issues, a gastroenterologist can be reached at any hour by calling [phone]. Do not use MyChart messaging  for urgent concerns.    DIET:  We do recommend a small meal at first, but then you may proceed to your regular diet.  Drink plenty of fluids but you should avoid alcoholic beverages for 24 hours.  ACTIVITY:  You should plan to take it easy for the rest of today and you should NOT DRIVE or use heavy machinery until tomorrow (because of the sedation medicines used during the test).    FOLLOW UP: Our staff will call the number listed on your records 48-72 hours following your procedure to check on you and address any questions or concerns that you may have regarding the information given to you following your procedure. If we do not reach you, we will leave a message.  We will attempt to reach you two times.  During this call, we will ask if you have developed any symptoms of COVID 19. If you develop any symptoms (ie: fever, flu-like symptoms, shortness of breath, cough etc.) before then, please call [phone].  If you test positive for Covid 19 in the 2 weeks post procedure, please call and report this information to [place].    If any biopsies were taken you will be contacted by phone or by letter within the next 1-3 weeks.  Please call us at [phone] if you have not heard about the biopsies in 3 weeks.    SIGNATURES/CONFIDENTIALITY: You and/or your care partner have signed paperwork which will be entered into your electronic medical record.  These signatures attest to the  fact that that the information above on your After Visit Summary has been reviewed and is understood.  Full responsibility of the confidentiality of this discharge information lies with you and/or your care-partner.

## 2020-12-24 ENCOUNTER — Other Ambulatory Visit: Payer: Self-pay

## 2020-12-24 DIAGNOSIS — K869 Disease of pancreas, unspecified: Secondary | ICD-10-CM

## 2020-12-24 DIAGNOSIS — R935 Abnormal findings on diagnostic imaging of other abdominal regions, including retroperitoneum: Secondary | ICD-10-CM

## 2020-12-25 ENCOUNTER — Telehealth: Payer: Self-pay

## 2020-12-25 NOTE — Telephone Encounter (Signed)
  Follow up Call-  Call back number 12/23/2020  Post procedure Call Back phone  # [phone]  Permission to leave phone message Yes     Patient questions:  Do you have a fever, pain , or abdominal swelling? No. Pain Score  0 *  Have you tolerated food without any problems? Yes.    Have you been able to return to your normal activities? Yes.    Do you have any questions about your discharge instructions: Diet   No. Medications  No. Follow up visit  No.  Do you have questions or concerns about your Care? No.  Actions: * If pain score is 4 or above: No action needed, pain <4.  Have you developed a fever since your procedure? no  2.   Have you had an respiratory symptoms (SOB or cough) since your procedure? no  3.   Have you tested positive for COVID 19 since your procedure no  4.   Have you had any family members/close contacts diagnosed with the COVID 19 since your procedure?  no   If yes to any of these questions please route to Joylene John, RN and Joella Prince, RN

## 2020-12-28 ENCOUNTER — Encounter: Payer: Self-pay | Admitting: Internal Medicine

## 2021-01-02 ENCOUNTER — Other Ambulatory Visit: Payer: Self-pay

## 2021-01-02 ENCOUNTER — Other Ambulatory Visit: Payer: Self-pay | Admitting: Internal Medicine

## 2021-01-02 ENCOUNTER — Ambulatory Visit (HOSPITAL_COMMUNITY)
Admission: RE | Admit: 2021-01-02 | Discharge: 2021-01-02 | Disposition: A | Discharge status: Home / Self Care | Payer: Self-pay | Source: Ambulatory Visit | Attending: Internal Medicine | Admitting: Internal Medicine

## 2021-01-02 DIAGNOSIS — R935 Abnormal findings on diagnostic imaging of other abdominal regions, including retroperitoneum: Secondary | ICD-10-CM | POA: Insufficient documentation

## 2021-01-02 DIAGNOSIS — K869 Disease of pancreas, unspecified: Secondary | ICD-10-CM | POA: Insufficient documentation

## 2021-01-02 MED ORDER — GADOBUTROL 1 MMOL/ML IV SOLN
8.5000 mL | Freq: Once | INTRAVENOUS | Status: AC | PRN
  Administered 2021-01-02: 17:00:00 8.5 mL via INTRAVENOUS

## 2021-01-15 ENCOUNTER — Ambulatory Visit (INDEPENDENT_AMBULATORY_CARE_PROVIDER_SITE_OTHER): Payer: Self-pay | Admitting: Internal Medicine

## 2021-01-15 ENCOUNTER — Encounter: Payer: Self-pay | Admitting: Internal Medicine

## 2021-01-15 VITALS — BP 148/88 | HR 54 | Ht 64.0 in | Wt 181.6 lb

## 2021-01-15 DIAGNOSIS — D122 Benign neoplasm of ascending colon: Secondary | ICD-10-CM

## 2021-01-15 DIAGNOSIS — R935 Abnormal findings on diagnostic imaging of other abdominal regions, including retroperitoneum: Secondary | ICD-10-CM

## 2021-01-15 DIAGNOSIS — I8500 Esophageal varices without bleeding: Secondary | ICD-10-CM

## 2021-01-15 DIAGNOSIS — K7460 Unspecified cirrhosis of liver: Secondary | ICD-10-CM

## 2021-01-15 DIAGNOSIS — K869 Disease of pancreas, unspecified: Secondary | ICD-10-CM

## 2021-01-15 NOTE — Progress Notes (Signed)
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  See that dictation for details.  The patient underwent multiple laboratories to assess for infectious and noninfectious causes for hepatic cirrhosis.  These all returned normal or negative.  She was found to be nave to hepatitis a and B and has since initiated vaccination series toward those viruses.  She subsequently underwent colonoscopy and upper endoscopy December 23, 2020.  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  Follow-up in 1 year to reassess the resection site from the large polyp recommended.  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  Repeat EGD in 1 year recommended.  Finally, she underwent MRI of the abdomen with and without contrast to reevaluate the pancreatic abnormalities.  She was found to have multiple cystic appearing lesions in the pancreas.  The largest measured 2.0 x 2.0 x 1.5.  This was located in the inferior aspect of the pancreatic head and uncinate process.  No definite communication with the main pancreatic duct.  Findings were felt to represent a benign cyst.  However, follow-up MRI in 6 months recommended.  Patient presents today for follow-up with her husband.  Her only complaint is fatigue.  They have multiple questions regarding her liver disease, pancreas, and colonic findings.  No new complaints or issues aside from recurrent minor epistaxis  REVIEW OF SYSTEMS:  All non-GI ROS negative unless otherwise stated in the HPI except for back pain, fatigue, itching,  nosebleeds, sleeping problems  Past Medical History:  Diagnosis Date   Diabetes mellitus without complication ([place])    History of Bell's palsy    History of PSVT (paroxysmal supraventricular tachycardia)    History of renal calculi    Hyperlipidemia    Hypertension    Skin cancer    Splenomegaly     Past Surgical History:  Procedure Laterality Date   APPENDECTOMY     BREAST DUCTAL SYSTEM EXCISION     COLONOSCOPY     SQUAMOUS CELL CARCINOMA EXCISION     nose   TUBAL LIGATION Bilateral    UPPER GASTROINTESTINAL ENDOSCOPY      Social History Sheri Ramirez  reports that she quit smoking about 34 years ago. Her smoking use included cigarettes. She has never used smokeless tobacco. She reports that she does not currently use alcohol. She reports that she does not currently use drugs.  family history includes Chronic Renal Failure in her mother; Congestive Heart Failure in her father and mother; Crohn's disease in her child; Diabetes in her father; Down syndrome in her half-brother; Hypertension in her mother; Leukemia in her half-brother and maternal aunt; Thyroid disease in her father and half-sister.  Allergies  Allergen Reactions   Amlodipine    Benicar [Olmesartan]    Indapamide    Lisinopril        PHYSICAL EXAMINATION: Vital signs: BP (!) 148/88   Pulse (!) 54   Ht 5\' 4"  (1.626 m)   Wt 181 lb 9 oz (82.4 kg)   SpO2 98%   BMI 31.17 kg/m   Constitutional: generally well-appearing, no acute distress Psychiatric: alert and oriented  x3, cooperative Eyes: extraocular movements intact, anicteric, conjunctiva pink Mouth: oral pharynx moist, no lesions Neck: supple no lymphadenopathy Cardiovascular: heart regular rate and rhythm, no murmur Lungs: clear to auscultation bilaterally Abdomen: soft, obese, nontender, nondistended, no obvious ascites, no peritoneal signs, normal bowel sounds, no organomegaly Rectal: Normal at colonoscopy Extremities: no clubbing, cyanosis,  or lower extremity edema bilaterally Skin: no lesions on visible extremities Neuro: No focal deficits. No asterixis.     ASSESSMENT:  1.  Hepatic cirrhosis with portal hypertension.  Suspect NASH.  Negative work-up otherwise.  Compensated. 2.  Grade 1 varices on EGD.  Mild to moderate portal hypertensive gastropathy. 3.  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 4.  Cystic lesions of the pancreas.  Recent MRI favors benign disease.  Asymptomatic. 5.  Obesity   PLAN:  1.  Discussion today on hepatic cirrhosis and NASH. 2.  Exercise and weight loss 3.  Discussion on pancreatic findings. 4.  Repeat MRI of the pancreas in 6 months 5.  Discussion on esophageal varices. 6.  Repeat EGD in 1 year 7.  Discussion on precancerous polyps. 8.  Repeat colonoscopy in 1 year. 9.  Repeat office visit in 6 months.  Repeat laboratories at that time.  Contact the office in the interim for any questions or problems 10.  Complete vaccination series against hepatitis A and hepatitis B. A total time of 40 minutes was spent preparing to see the patient, reviewing multiple tests and x-rays as well as endoscopic findings and pathology, obtaining interval history, performing medically appropriate physical examination, counseling the patient and her husband regarding multiple above listed issues, ordering follow-up imaging and clinical assessment.  Finally, documenting clinical information in the health record

## 2021-01-15 NOTE — Patient Instructions (Signed)
If you are age 62 or older, your body mass index should be between 23-30. Your Body mass index is 31.17 kg/m. If this is out of the aforementioned range listed, please consider follow up with your Primary Care Provider.  If you are age 32 or younger, your body mass index should be between 19-25. Your Body mass index is 31.17 kg/m. If this is out of the aformentioned range listed, please consider follow up with your Primary Care Provider.   ________________________________________________________  The [facility] providers would like to encourage you to use [facility] to communicate with providers for non-urgent requests or questions.  Due to long hold times on the telephone, sending your provider a message by [facility] may be a faster and more efficient way to get a response.  Please allow 48 business hours for a response.  Please remember that this is for non-urgent requests.  _______________________________________________________  Continue to work on exercise and weight loss  Please follow up in 6 months

## 2021-04-29 ENCOUNTER — Ambulatory Visit (INDEPENDENT_AMBULATORY_CARE_PROVIDER_SITE_OTHER): Payer: Self-pay | Admitting: Internal Medicine

## 2021-04-29 DIAGNOSIS — Z23 Encounter for immunization: Secondary | ICD-10-CM

## 2021-07-01 ENCOUNTER — Encounter: Payer: Self-pay | Admitting: Internal Medicine

## 2021-07-02 ENCOUNTER — Other Ambulatory Visit: Payer: Self-pay

## 2021-07-02 DIAGNOSIS — K869 Disease of pancreas, unspecified: Secondary | ICD-10-CM

## 2021-07-15 ENCOUNTER — Other Ambulatory Visit: Payer: Self-pay | Admitting: Internal Medicine

## 2021-07-15 ENCOUNTER — Ambulatory Visit (HOSPITAL_COMMUNITY)
Admission: RE | Admit: 2021-07-15 | Discharge: 2021-07-15 | Disposition: A | Discharge status: Home / Self Care | Payer: Self-pay | Source: Ambulatory Visit | Attending: Internal Medicine | Admitting: Internal Medicine

## 2021-07-15 DIAGNOSIS — K869 Disease of pancreas, unspecified: Secondary | ICD-10-CM

## 2021-07-15 MED ORDER — GADOBUTROL 1 MMOL/ML IV SOLN
8.0000 mL | Freq: Once | INTRAVENOUS | Status: AC | PRN
  Administered 2021-07-15: 8 mL via INTRAVENOUS

## 2021-12-11 ENCOUNTER — Encounter: Payer: Self-pay | Admitting: Internal Medicine

## 2021-12-25 ENCOUNTER — Encounter: Payer: Self-pay | Admitting: Internal Medicine

## 2022-01-14 DIAGNOSIS — E139 Other specified diabetes mellitus without complications: Secondary | ICD-10-CM | POA: Insufficient documentation

## 2022-01-22 ENCOUNTER — Ambulatory Visit (AMBULATORY_SURGERY_CENTER): Payer: Self-pay

## 2022-01-22 VITALS — Ht 64.0 in | Wt 191.0 lb

## 2022-01-22 DIAGNOSIS — Z86010 Personal history of colonic polyps: Secondary | ICD-10-CM

## 2022-01-22 MED ORDER — SUTAB 1479-225-188 MG PO TABS: 1.0000 | ORAL_TABLET | ORAL | 0 refills | Status: DC

## 2022-01-22 NOTE — Progress Notes (Signed)

## 2022-02-19 ENCOUNTER — Encounter: Payer: Self-pay | Admitting: Internal Medicine

## 2022-02-19 ENCOUNTER — Ambulatory Visit (AMBULATORY_SURGERY_CENTER): Payer: Self-pay | Admitting: Internal Medicine

## 2022-02-19 VITALS — BP 149/78 | HR 93 | Temp 98.1°F | Resp 21 | Ht 64.0 in | Wt 191.0 lb

## 2022-02-19 DIAGNOSIS — K869 Disease of pancreas, unspecified: Secondary | ICD-10-CM

## 2022-02-19 DIAGNOSIS — Z09 Encounter for follow-up examination after completed treatment for conditions other than malignant neoplasm: Secondary | ICD-10-CM

## 2022-02-19 DIAGNOSIS — K766 Portal hypertension: Secondary | ICD-10-CM

## 2022-02-19 DIAGNOSIS — Z86010 Personal history of colonic polyps: Secondary | ICD-10-CM

## 2022-02-19 DIAGNOSIS — I8510 Secondary esophageal varices without bleeding: Secondary | ICD-10-CM

## 2022-02-19 DIAGNOSIS — D122 Benign neoplasm of ascending colon: Secondary | ICD-10-CM

## 2022-02-19 DIAGNOSIS — I8500 Esophageal varices without bleeding: Secondary | ICD-10-CM

## 2022-02-19 DIAGNOSIS — K7460 Unspecified cirrhosis of liver: Secondary | ICD-10-CM

## 2022-02-19 DIAGNOSIS — K3189 Other diseases of stomach and duodenum: Secondary | ICD-10-CM

## 2022-02-19 MED ORDER — SODIUM CHLORIDE 0.9 % IV SOLN: 500.0000 mL | Freq: Once | INTRAVENOUS | Status: DC

## 2022-02-19 NOTE — Progress Notes (Signed)
Pt's states no medical or surgical changes since previsit or office visit. VS assessed by D.T 

## 2022-02-19 NOTE — Progress Notes (Signed)
HISTORY OF PRESENT ILLNESS:   Sheri Ramirez is a 62 y.o. female, retired Music therapist, who was evaluated as a new patient on October 16, 2020 regarding newly diagnosed hepatic cirrhosis with portal hypertension, cystic lesion of the pancreas of uncertain clinical significance, and polypoid colonic lesion on CT scan.  See that dictation for details.  The patient underwent multiple laboratories to assess for infectious and noninfectious causes for hepatic cirrhosis.  These all returned normal or negative.  She was found to be nave to hepatitis a and B and has since initiated vaccination series toward those viruses.  She subsequently underwent colonoscopy and upper endoscopy December 23, 2020.  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  Follow-up in 1 year to reassess the resection site from the large polyp recommended.  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  Repeat EGD in 1 year recommended.  Finally, she underwent MRI of the abdomen with and without contrast to reevaluate the pancreatic abnormalities.  She was found to have multiple cystic appearing lesions in the pancreas.  The largest measured 2.0 x 2.0 x 1.5.  This was located in the inferior aspect of the pancreatic head and uncinate process.  No definite communication with the main pancreatic duct.  Findings were felt to represent a benign cyst.  However, follow-up MRI in 6 months recommended.  Patient presents today for follow-up with her husband.  Her only complaint is fatigue.  They have multiple questions regarding her liver disease, pancreas, and colonic findings.  No new complaints or issues aside from recurrent minor epistaxis   REVIEW OF SYSTEMS:   All non-GI ROS negative unless otherwise stated in the HPI except for back pain, fatigue, itching,  nosebleeds, sleeping problems       Past Medical History:  Diagnosis Date   Diabetes mellitus without complication (Sheri Ramirez)     History of Bell's palsy     History of PSVT (paroxysmal supraventricular tachycardia)     History of renal calculi     Hyperlipidemia     Hypertension     Skin cancer     Splenomegaly             Past Surgical History:  Procedure Laterality Date   APPENDECTOMY       BREAST DUCTAL SYSTEM EXCISION       COLONOSCOPY       SQUAMOUS CELL CARCINOMA EXCISION        nose   TUBAL LIGATION Bilateral     UPPER GASTROINTESTINAL ENDOSCOPY          Social History Sheri Ramirez  reports that she quit smoking about 34 years ago. Her smoking use included cigarettes. She has never used smokeless tobacco. She reports that she does not currently use alcohol. She reports that she does not currently use drugs.   family history includes Chronic Renal Failure in her mother; Congestive Heart Failure in her father and mother; Crohn's disease in her child; Diabetes in her father; Down syndrome in her half-brother; Hypertension in her mother; Leukemia in her half-brother and maternal aunt; Thyroid disease in her father and half-sister.       Allergies  Allergen Reactions   Amlodipine     Benicar [Olmesartan]     Indapamide     Lisinopril  PHYSICAL EXAMINATION: Vital signs: BP (!) 148/88   Pulse (!) 54   Ht '5\' 4"'$  (1.626 m)   Wt 181 lb 9 oz (82.4 kg)   SpO2 98%   BMI 31.17 kg/m   Constitutional: generally well-appearing, no acute distress Psychiatric: alert and oriented x3, cooperative Eyes: extraocular movements intact, anicteric, conjunctiva pink Mouth: oral pharynx moist, no lesions Neck: supple no lymphadenopathy Cardiovascular: heart regular rate and rhythm, no murmur Lungs: clear to auscultation bilaterally Abdomen: soft, obese, nontender, nondistended, no obvious ascites, no peritoneal signs, normal bowel sounds, no organomegaly Rectal: Normal at  colonoscopy Extremities: no clubbing, cyanosis, or lower extremity edema bilaterally Skin: no lesions on visible extremities Neuro: No focal deficits. No asterixis.        ASSESSMENT:   1.  Hepatic cirrhosis with portal hypertension.  Suspect NASH.  Negative work-up otherwise.  Compensated. 2.  Grade 1 varices on EGD.  Mild to moderate portal hypertensive gastropathy. 3.  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 4.  Cystic lesions of the pancreas.  Recent MRI favors benign disease.  Asymptomatic. 5.  Obesity     PLAN:   1.  Discussion today on hepatic cirrhosis and NASH. 2.  Exercise and weight loss 3.  Discussion on pancreatic findings. 4.  Repeat MRI of the pancreas in 6 months 5.  Discussion on esophageal varices. 6.  Repeat EGD in 1 year 7.  Discussion on precancerous polyps. 8.  Repeat colonoscopy in 1 year. 9.  Repeat office visit in 6 months.  Repeat laboratories at that time.  Contact the office in the interim for any questions or problems 10.  Complete vaccination series against hepatitis A and hepatitis B.  No significant interval clinical changes since her office evaluation as outlined above.  She did undergo repeat MRI of the pancreas May 2023.  Stable lesion.  Was to follow-up in 6 months.  Has not.  Today for colonoscopy to follow-up on large right-sided lesion removed November 2022 via EMR technique.  Also upper endoscopy for screening/surveillance of varices.

## 2022-02-19 NOTE — Op Note (Signed)
[facility] Patient Name: Sheri Ramirez Procedure Date: 02/19/2022 9:58 AM MRN: [id number] Endoscopist: Docia Chuck. Henrene Pastor , MD, [id number] Age: 62 Referring MD:  Date of Birth: 1961/01/01 Gender: Female Account #: [id number] Procedure:                Upper GI endoscopy Indications:              Esophageal varices, Follow-up of esophageal varices Medicines:                Monitored Anesthesia Care Procedure:                Pre-Anesthesia Assessment:                           - Prior to the procedure, a History and Physical                            was performed, and patient medications and                            allergies were reviewed. The patient's tolerance of                            previous anesthesia was also reviewed. The risks                            and benefits of the procedure and the sedation                            options and risks were discussed with the patient.                            All questions were answered, and informed consent                            was obtained. Prior Anticoagulants: The patient has                            taken no anticoagulant or antiplatelet agents. ASA                            Grade Assessment: III - A patient with severe                            systemic disease. After reviewing the risks and                            benefits, the patient was deemed in satisfactory                            condition to undergo the procedure.                           After obtaining informed consent, the endoscope was  passed under direct vision. Throughout the                            procedure, the patient's blood pressure, pulse, and                            oxygen saturations were monitored continuously. The                            Endoscope was introduced through the mouth, and                            advanced to the second part of duodenum. The upper                             GI endoscopy was accomplished without difficulty.                            The patient tolerated the procedure well. Scope In: Scope Out: Findings:                 The esophagus revealed several, 2+ varices in the                            distal third of the esophagus. No stigmata.                           The stomach revealed diffuse moderate portal                            hypertensive gastropathy with hematin throughout.                            No proximal gastric varices.                           The examined duodenum was normal.                           The cardia and gastric fundus were normal on                            retroflexion, save small ventral hernia. Complications:            No immediate complications. Estimated Blood Loss:     Estimated blood loss: none. Impression:               1. 1-2+ esophageal varices without stigmata                           2. Moderate portal hypertensive gastropathy                           3. Otherwise unremarkable EGD.                           Also,  cystic lesion of the pancreas being followed                            by MRI. Due for follow-up Recommendation:           - Patient has a contact number available for                            emergencies. The signs and symptoms of potential                            delayed complications were discussed with the                            patient. Return to normal activities tomorrow.                            Written discharge instructions were provided to the                            patient.                           - Resume previous diet.                           - Continue present medications.                           - Repeat EGD in 1 year for surveillance                           - PLEASE SCHEDULE Rosanne Sack, RN) MRI of the                            pancreas without contrast, "follow-up pancreatic                            cystic lesion" Docia Chuck. Henrene Pastor,  MD 02/19/2022 10:45:58 AM This report has been signed electronically.

## 2022-02-19 NOTE — Op Note (Signed)
[facility] Patient Name: Sheri Ramirez Procedure Date: 02/19/2022 9:59 AM MRN: [id number] Endoscopist: Docia Chuck. Henrene Pastor , MD, [id number] Age: 62 Referring MD:  Date of Birth: 1960-07-04 Gender: Female Account #: [id number] Procedure:                Colonoscopy with cold snare polypectomy x 1; with                            biopsies Indications:              High risk colon cancer surveillance: Personal                            history of adenoma (10 mm or greater in size), High                            risk colon cancer surveillance: Personal history of                            adenoma with villous component, High risk colon                            cancer surveillance: Personal history of multiple                            (3 or more) adenomas. Previous examination November                            2022 with large right-sided tubulovillous adenoma                            removed via EMR technique. Tattooed. Now for                            follow-up Medicines:                Monitored Anesthesia Care Procedure:                Pre-Anesthesia Assessment:                           - Prior to the procedure, a History and Physical                            was performed, and patient medications and                            allergies were reviewed. The patient's tolerance of                            previous anesthesia was also reviewed. The risks                            and benefits of the procedure and the sedation  options and risks were discussed with the patient.                            All questions were answered, and informed consent                            was obtained. Prior Anticoagulants: The patient has                            taken no anticoagulant or antiplatelet agents. ASA                            Grade Assessment: III - A patient with severe                            systemic disease. After reviewing  the risks and                            benefits, the patient was deemed in satisfactory                            condition to undergo the procedure.                           After obtaining informed consent, the colonoscope                            was passed under direct vision. Throughout the                            procedure, the patient's blood pressure, pulse, and                            oxygen saturations were monitored continuously. The                            Colonoscope was introduced through the anus and                            advanced to the the cecum, identified by                            appendiceal orifice and ileocecal valve. The                            ileocecal valve, appendiceal orifice, and rectum                            were photographed. The quality of the bowel                            preparation was excellent. The colonoscopy was  performed without difficulty. The patient tolerated                            the procedure well. The bowel preparation used was                            SUPREP via split dose instruction. Scope In: 10:07:39 AM Scope Out: 10:20:21 AM Scope Withdrawal Time: 0 hours 10 minutes 5 seconds  Total Procedure Duration: 0 hours 12 minutes 42 seconds  Findings:                 A 4 mm polyp was found in the ascending colon. The                            polyp was removed with a cold snare. Resection and                            retrieval were complete.                           There was prior marking tattoo in the right colon.                            The scar from previous polypectomy site identified.                            This was biopsied to rule out residual adenoma. The                            colonic mucosa throughout revealed changes                            consistent with portal colopathy. The entire                            examined colon appeared otherwise  normal on direct                            and retroflexion views. Hemorrhoids Complications:            No immediate complications. Estimated blood loss:                            None. Estimated Blood Loss:     Estimated blood loss: none. Impression:               - One 4 mm polyp in the ascending colon, removed                            with a cold snare. Resected and retrieved.                           ?" Prior resection site does not show any obvious  evidence of recurrent adenoma. Scar biopsied                           ?" Portal colopathy. Small hemorrhoids                           - The entire examined colon is otherwise normal on                            direct and retroflexion views. Recommendation:           - Repeat colonoscopy in 3 years for surveillance.                           - Patient has a contact number available for                            emergencies. The signs and symptoms of potential                            delayed complications were discussed with the                            patient. Return to normal activities tomorrow.                            Written discharge instructions were provided to the                            patient.                           - Resume previous diet.                           - Continue present medications.                           - Await pathology results. Docia Chuck. Henrene Pastor, MD 02/19/2022 10:27:57 AM This report has been signed electronically.

## 2022-02-19 NOTE — Progress Notes (Signed)
Pt resting comfortably. VSS. Airway intact. SBAR complete to RN. All questions answered.   

## 2022-02-19 NOTE — Patient Instructions (Signed)
Please read handouts provided. Continue present medications. Await pathology results. Repeat colonoscopy in 3 years for screening. Repeat EGD in 1 year for screening. MRI of the pancreas without contrast.   YOU HAD AN ENDOSCOPIC PROCEDURE TODAY AT [facility]:   Refer to the procedure report that was given to you for any specific questions about what was found during the examination.  If the procedure report does not answer your questions, please call your gastroenterologist to clarify.  If you requested that your care partner not be given the details of your procedure findings, then the procedure report has been included in a sealed envelope for you to review at your convenience later.  YOU SHOULD EXPECT: Some feelings of bloating in the abdomen. Passage of more gas than usual.  Walking can help get rid of the air that was put into your GI tract during the procedure and reduce the bloating. If you had a lower endoscopy (such as a colonoscopy or flexible sigmoidoscopy) you may notice spotting of blood in your stool or on the toilet paper. If you underwent a bowel prep for your procedure, you may not have a normal bowel movement for a few days.  Please Note:  You might notice some irritation and congestion in your nose or some drainage.  This is from the oxygen used during your procedure.  There is no need for concern and it should clear up in a day or so.  SYMPTOMS TO REPORT IMMEDIATELY:  Following lower endoscopy (colonoscopy or flexible sigmoidoscopy):  Excessive amounts of blood in the stool  Significant tenderness or worsening of abdominal pains  Swelling of the abdomen that is new, acute  Fever of 100F or higher  Following upper endoscopy (EGD)  Vomiting of blood or coffee ground material  New chest pain or pain under the shoulder blades  Painful or persistently difficult swallowing  New shortness of breath  Fever of 100F or higher  Black, tarry-looking  stools  For urgent or emergent issues, a gastroenterologist can be reached at any hour by calling [phone]. Do not use MyChart messaging for urgent concerns.    DIET:  We do recommend a small meal at first, but then you may proceed to your regular diet.  Drink plenty of fluids but you should avoid alcoholic beverages for 24 hours.  ACTIVITY:  You should plan to take it easy for the rest of today and you should NOT DRIVE or use heavy machinery until tomorrow (because of the sedation medicines used during the test).    FOLLOW UP: Our staff will call the number listed on your records the next business day following your procedure.  We will call around 7:15- 8:00 am to check on you and address any questions or concerns that you may have regarding the information given to you following your procedure. If we do not reach you, we will leave a message.     If any biopsies were taken you will be contacted by phone or by letter within the next 1-3 weeks.  Please call us at [phone] if you have not heard about the biopsies in 3 weeks.    SIGNATURES/CONFIDENTIALITY: You and/or your care partner have signed paperwork which will be entered into your electronic medical record.  These signatures attest to the fact that that the information above on your After Visit Summary has been reviewed and is understood.  Full responsibility of the confidentiality of this discharge information lies with you and/or your  care-partner.

## 2022-02-19 NOTE — Progress Notes (Signed)
Called to room to assist during endoscopic procedure.  Patient ID and intended procedure confirmed with present staff. Received instructions for my participation in the procedure from the performing physician.  

## 2022-02-20 ENCOUNTER — Other Ambulatory Visit: Payer: Self-pay

## 2022-02-20 ENCOUNTER — Telehealth: Payer: Self-pay

## 2022-02-20 DIAGNOSIS — K869 Disease of pancreas, unspecified: Secondary | ICD-10-CM

## 2022-02-20 NOTE — Telephone Encounter (Signed)
  Follow up Call-     02/19/2022    9:27 AM 12/23/2020    1:38 PM  Call back number  Post procedure Call Back phone  # [phone]  Permission to leave phone message Yes Yes     Patient questions:  Do you have a fever, pain , or abdominal swelling? No. Pain Score  0 *  Have you tolerated food without any problems? Yes.    Have you been able to return to your normal activities? Yes.    Do you have any questions about your discharge instructions: Diet   No. Medications  No. Follow up visit  No.  Do you have questions or concerns about your Care? No.  Actions: * If pain score is 4 or above: No action needed, pain <4.

## 2022-02-20 NOTE — Telephone Encounter (Signed)
Order in epic for mri of abd pancreatic protocol w w /o contrast. Rad scheduling notified, they will contact pt to schedule exam.

## 2022-02-25 ENCOUNTER — Encounter: Payer: Self-pay | Admitting: Internal Medicine

## 2022-03-06 ENCOUNTER — Ambulatory Visit (HOSPITAL_COMMUNITY)
Admission: RE | Admit: 2022-03-06 | Discharge: 2022-03-06 | Disposition: A | Discharge status: Home / Self Care | Payer: Self-pay | Source: Ambulatory Visit | Attending: Internal Medicine | Admitting: Internal Medicine

## 2022-03-06 ENCOUNTER — Other Ambulatory Visit: Payer: Self-pay | Admitting: Internal Medicine

## 2022-03-06 DIAGNOSIS — K869 Disease of pancreas, unspecified: Secondary | ICD-10-CM

## 2022-03-06 MED ORDER — GADOBUTROL 1 MMOL/ML IV SOLN
9.0000 mL | Freq: Once | INTRAVENOUS | Status: AC | PRN
  Administered 2022-03-06: 9 mL via INTRAVENOUS

## 2022-09-04 ENCOUNTER — Telehealth: Payer: Self-pay | Admitting: *Deleted

## 2022-09-04 DIAGNOSIS — K869 Disease of pancreas, unspecified: Secondary | ICD-10-CM

## 2022-09-04 NOTE — Telephone Encounter (Signed)
I have spoken to patient to advise of MR abd w/wo. She has been scheduled at [facility] on Saturday, 09/12/22 at 8 am, 730 am arrival. Patient advised she will need to check in at the Emergency Room for registration. Also advised patient should should be NPO 4 hours prior to her test. Patient verbalizes understanding. She does state that she had an MRI of her spine not long ago at a different facility and did have some panic feelings. She is unsure whether she would like to request any medication for this. She will call back if she decides to request a medication prior to the appointment.  I did confirm with patient that she is self pay, so no precertification will be needed with insurance.

## 2022-09-04 NOTE — Telephone Encounter (Signed)
-----   Message from Nurse Kerrie Buffalo sent at 03/06/2022 12:57 PM EST ----- Regarding: MR Abd w w/o Pt needs MRI of pancreas in 6 mths

## 2022-09-07 NOTE — Telephone Encounter (Signed)
PT is returning call to advise [place] that she would like a medication prescribed for anxiety. Please advise.

## 2022-09-08 MED ORDER — DIAZEPAM 5 MG PO TABS: 5.0000 mg | ORAL_TABLET | Freq: Once | ORAL | 0 refills | Status: AC

## 2022-09-08 NOTE — Telephone Encounter (Signed)
Dr Marina Goodell-  Are you okay giving a one time dose of anti-anxiety medication for patient to take prior to her MR abdomen?

## 2022-09-08 NOTE — Telephone Encounter (Signed)
I have spoken to patient to advise that Dr Marina Goodell has okayed 1 time dosing of valium prior to her MRI. Advised that if she does take this medication, she will need a driver due to its sedating effect. Patient states her husband will be bringing her. Rx sent to pharmacy.

## 2022-09-08 NOTE — Addendum Note (Signed)
Addended by: Richardson Chiquito on: 09/08/2022 09:46 AM   Modules accepted: Orders

## 2022-09-08 NOTE — Telephone Encounter (Signed)
Okay to prescribe 5 mg of Valium to be taken 10 minutes prior to her examination. She should make sure that she has a driver if she takes this medication

## 2022-09-12 ENCOUNTER — Ambulatory Visit (HOSPITAL_COMMUNITY)
Admission: RE | Admit: 2022-09-12 | Discharge: 2022-09-12 | Disposition: A | Discharge status: Home / Self Care | Payer: Self-pay | Source: Ambulatory Visit | Attending: Internal Medicine | Admitting: Internal Medicine

## 2022-09-12 ENCOUNTER — Other Ambulatory Visit: Payer: Self-pay | Admitting: Internal Medicine

## 2022-09-12 DIAGNOSIS — K869 Disease of pancreas, unspecified: Secondary | ICD-10-CM | POA: Insufficient documentation

## 2022-09-12 MED ORDER — GADOBUTROL 1 MMOL/ML IV SOLN
8.5000 mL | Freq: Once | INTRAVENOUS | Status: AC | PRN
  Administered 2022-09-12: 8.5 mL via INTRAVENOUS

## 2022-12-09 LAB — COMPREHENSIVE METABOLIC PANEL: EGFR: 64

## 2023-01-17 IMAGING — MR MR ABDOMEN WO/W CM MRCP
19 of 22 series · 44 of 48 positions shown · IV contrast (8ML GADAVIST)
Comparison: 01/02/2021

CLINICAL DATA: Follow-up cystic pancreatic lesion.

EXAM:
MRI ABDOMEN WITHOUT AND WITH CONTRAST (INCLUDING MRCP)
TECHNIQUE: Multiplanar multisequence MR imaging of the abdomen was performed
both before and after the administration of intravenous contrast.
Heavily T2-weighted images of the biliary and pancreatic ducts were
obtained, and three-dimensional MRCP images were rendered by post
processing.
CONTRAST:  8mL GADAVIST GADOBUTROL 1 MMOL/ML IV SOLN

[Series 2: DWI · axial · 6.0mm · 1.49mm/px · z∈[-169,+127]mm · 2 of 84 slices shown (1 of 2)]
[im 1/84]
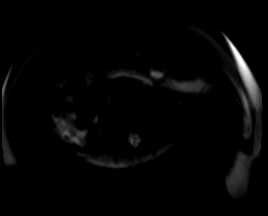
[im 84/84]
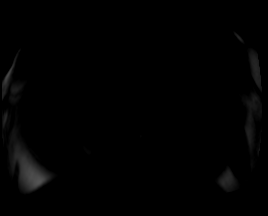

[Series 3: DWI · axial · 6.0mm · 1.49mm/px · 1 of 42 slices shown (2 of 2)]
[im 1/42]
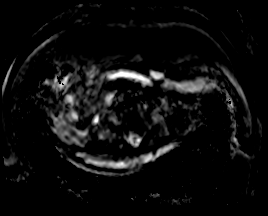

[Series 5: T2 fat-sat · axial · 6.0mm · 1.25mm/px · 1 of 40 slices shown]
[im 1/40]
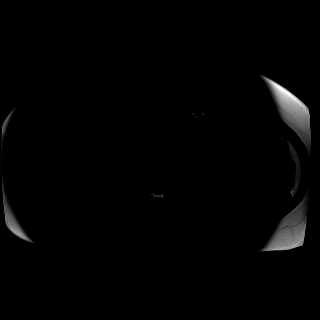

[Series 7: cor_3d_spc_trig · coronal · 1.0mm · 0.49mm/px · 2 of 72 slices shown]
[im 1/72]
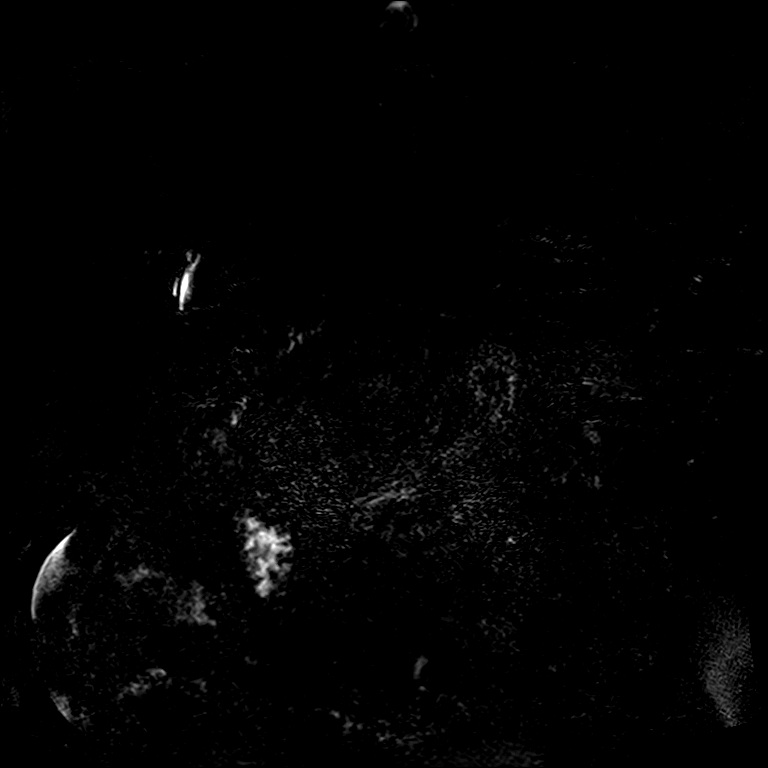
[im 72/72]
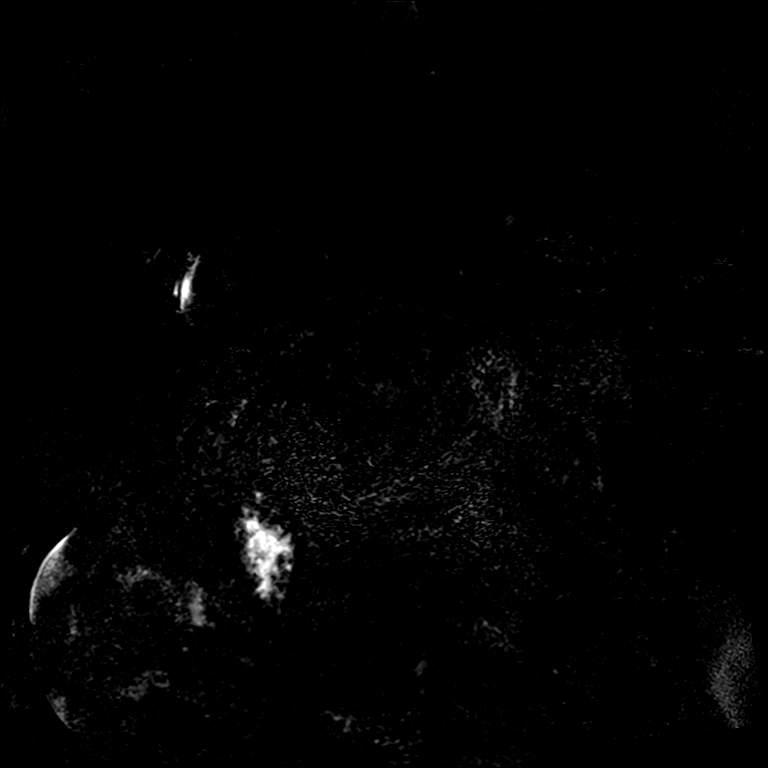

[Series 10: T2 · coronal · 6.0mm · 1.64mm/px · 1 of 34 slices shown (1 of 2)]
[im 1/34]
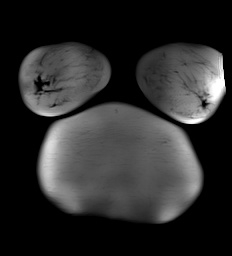

[Series 12: T1 · axial · 3.8mm · 1.25mm/px · z∈[-162,+108]mm · 2 of 72 slices shown (1 of 2)]
[im 1/72]
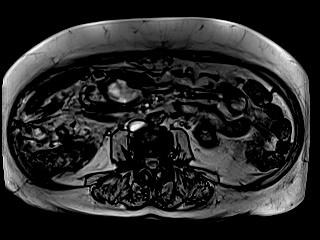
[im 72/72]
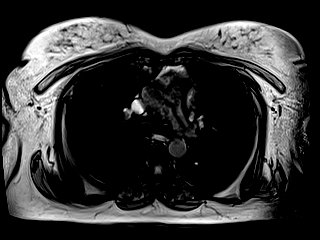

[Series 13: T1 · axial · 3.8mm · 1.25mm/px · z∈[-162,+108]mm · 3 of 72 slices shown (2 of 2)]
[im 1/72]
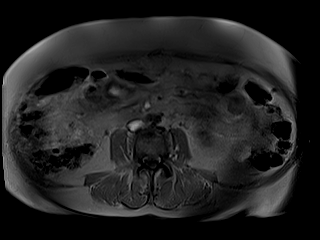
[im 36/72]
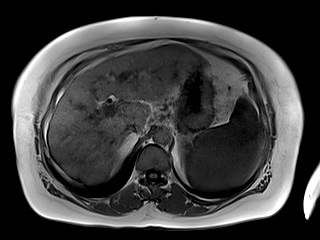
[im 72/72]
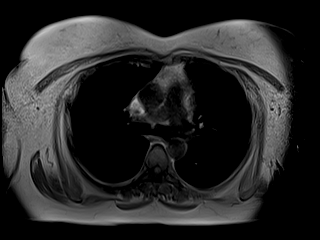

[Series 15: cor obl thk · sagittal · 50.0mm · 0.78mm/px · 1 of 9 slices shown]
[im 1/9]
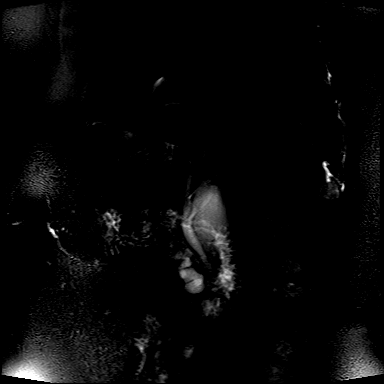

[Series 16: T2 · axial · 6.0mm · 1.56mm/px · z∈[-207,+89]mm · 2 of 42 slices shown (2 of 2)]
[im 1/42]
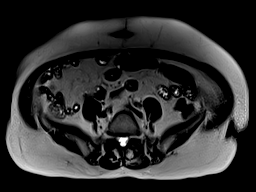
[im 42/42]
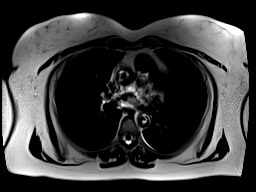

[Series 18: T1 dynamic · axial · 3.6mm · 1.25mm/px · z∈[-188,+96]mm · 3 of 80 slices shown (1 of 10)]
[im 1/80]
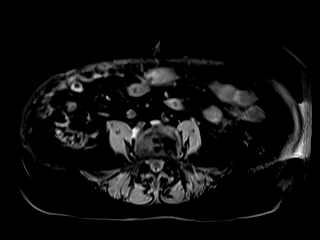
[im 40/80]
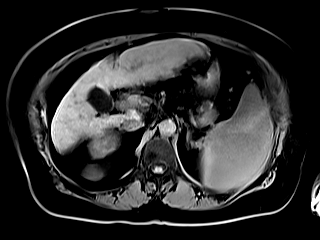
[im 80/80]
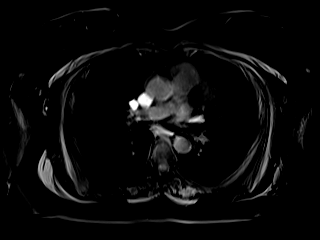

[Series 22: T1 dynamic · axial · 3.6mm · 1.25mm/px · z∈[-188,+96]mm · 3 of 80 slices shown (2 of 10)]
[im 1/80]
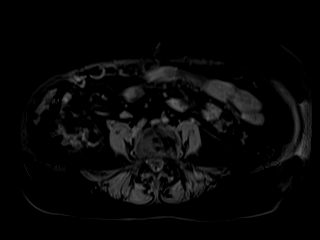
[im 40/80]
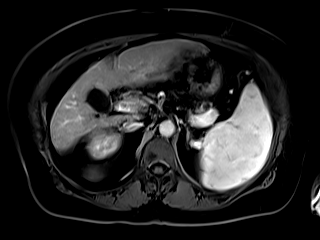
[im 80/80]
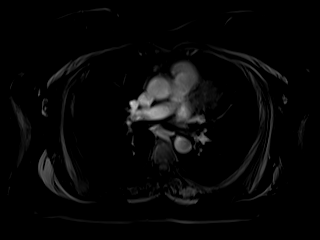

[Series 23: T1 dynamic · axial · 3.6mm · 1.25mm/px · z∈[-188,+96]mm · 3 of 80 slices shown (3 of 10)]
[im 1/80]
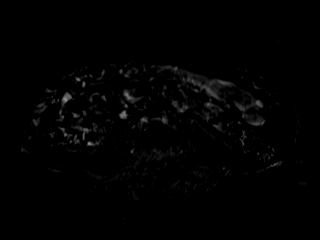
[im 40/80]
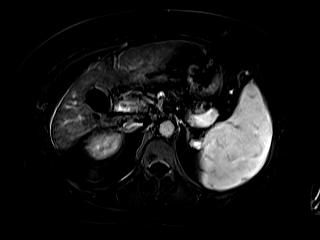
[im 80/80]
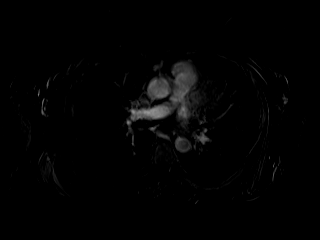

[Series 26: T1 dynamic · axial · 3.6mm · 1.25mm/px · z∈[-188,+96]mm · 3 of 80 slices shown (4 of 10)]
[im 1/80]
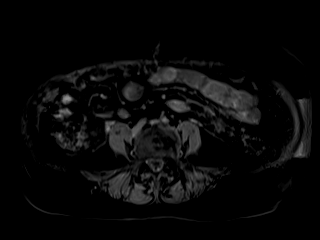
[im 40/80]
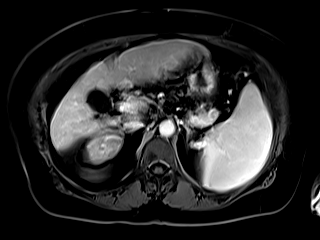
[im 80/80]
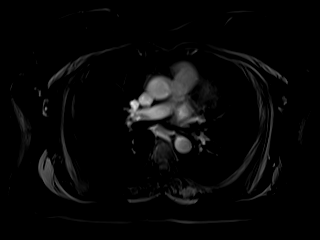

[Series 27: T1 dynamic · axial · 3.6mm · 1.25mm/px · z∈[-188,+96]mm · 3 of 80 slices shown (5 of 10)]
[im 1/80]
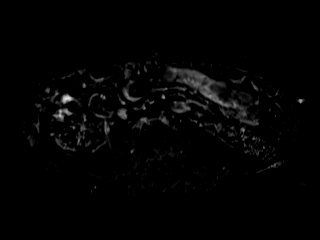
[im 40/80]
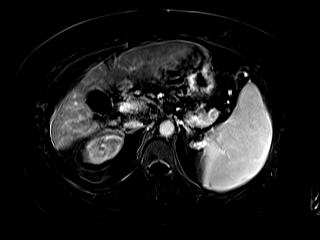
[im 80/80]
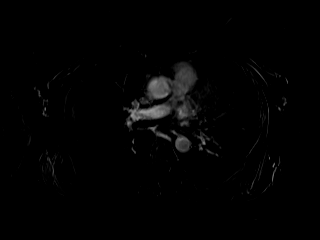

[Series 30: T1 dynamic · axial · 3.6mm · 1.25mm/px · z∈[-188,+96]mm · 3 of 80 slices shown (6 of 10)]
[im 1/80]
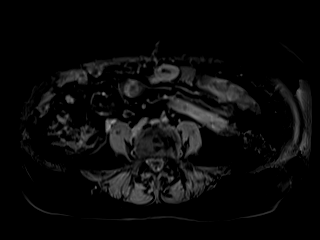
[im 40/80]
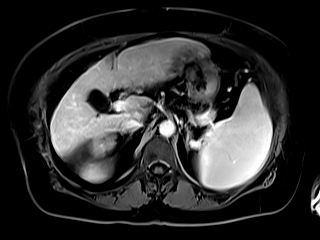
[im 80/80]
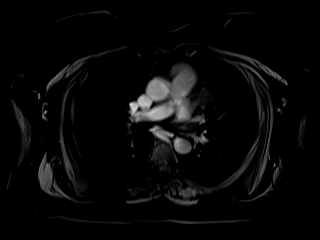

[Series 31: T1 dynamic · axial · 3.6mm · 1.25mm/px · z∈[-188,+96]mm · 3 of 80 slices shown (7 of 10)]
[im 1/80]
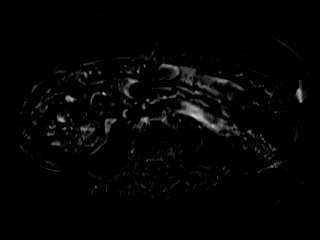
[im 40/80]
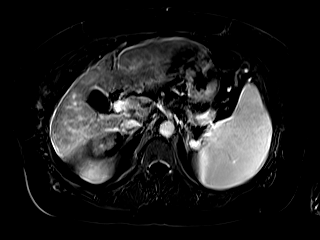
[im 80/80]
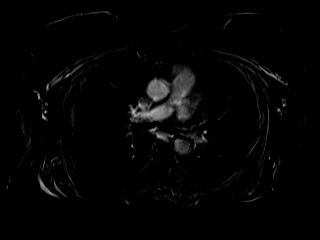

[Series 33: T1 dynamic · coronal · 5.0mm · 1.41mm/px · 2 of 56 slices shown (8 of 10)]
[im 1/56]
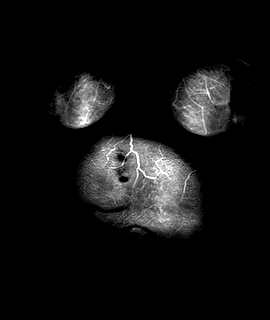
[im 56/56]
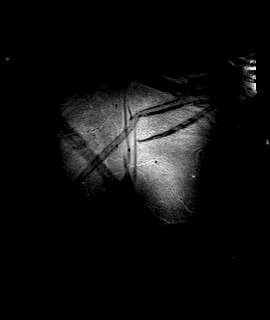

[Series 36: T1 dynamic · axial · 3.6mm · 1.25mm/px · z∈[-188,+96]mm · 3 of 80 slices shown (9 of 10)]
[im 1/80]
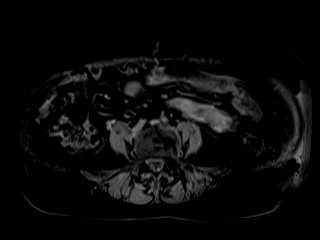
[im 40/80]
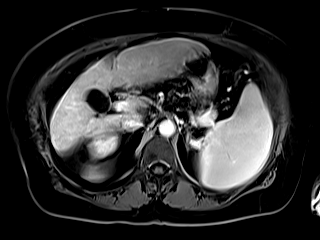
[im 80/80]
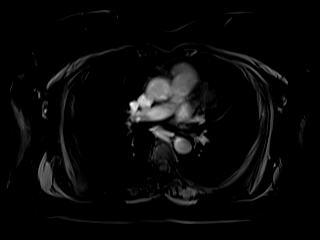

[Series 37: T1 dynamic · axial · 3.6mm · 1.25mm/px · z∈[-188,+96]mm · 3 of 80 slices shown (10 of 10)]
[im 1/80]
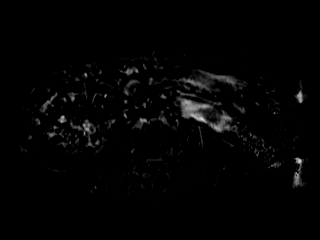
[im 40/80]
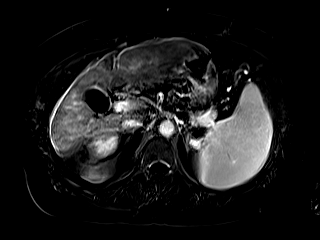
[im 80/80]
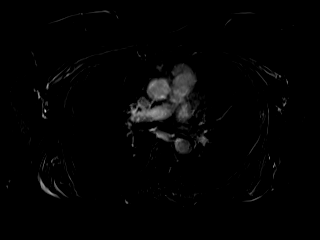

[44 of 48 positions shown; findings below may reference images not displayed]

FINDINGS: Lower chest: No acute findings.

Hepatobiliary: Morphologic changes of hepatic cirrhosis are again
demonstrated. No hepatic masses identified. Recanalization of
paraumbilical veins and mild esophageal varices again seen,
consistent with portal venous hypertension. Gallbladder is
unremarkable. No evidence of biliary ductal dilatation.

Pancreas: Multiple small cystic lesions are again seen scattered
throughout the head, body, and tail, which show no significant
change since previous study. Largest lesion in the uncinate process
measures 2.2 x 2.0 cm on coronal image [DATE], and shows a few thin
internal septations. No solid or nodular components, or other
worrisome features identified. No evidence of main pancreatic ductal
dilatation or pancreas divisum.

Spleen: Stable moderate splenomegaly, measuring approximately 18 cm
in length, suspicious for portal venous hypertension.

Adrenals/Urinary Tract: No masses identified. No evidence of
hydronephrosis.

Stomach/Bowel: Unremarkable.

Vascular/Lymphatic: No pathologically enlarged lymph nodes
identified. No acute vascular findings.

Other:  None.  No evidence of ascites.

Musculoskeletal:  No suspicious bone lesions identified.
IMPRESSION: Stable cystic lesions scattered throughout the pancreas, largest in
the pancreatic head measuring 2.2 cm. These are suspicious for
indolent cystic neoplasms, such as side-branch intraductal papillary
mucinous neoplasms. Recommend continued follow-up by MRI in 6
months. This recommendation follows ACR consensus guidelines:
Management of Incidental Pancreatic Cysts: A White Paper of the ACR
Incidental Findings Committee. [HOSPITAL] 5442;[DATE].

Cirrhosis and findings of portal venous hypertension. No evidence of
hepatocellular carcinoma.

## 2023-03-15 ENCOUNTER — Other Ambulatory Visit: Payer: Self-pay | Admitting: Oncology

## 2023-03-15 DIAGNOSIS — D708 Other neutropenia: Secondary | ICD-10-CM

## 2023-03-15 NOTE — Progress Notes (Signed)
 [facility]  [address] [place],  [place]  [address] [phone]  Clinic Day:  03/16/2023  Referring physician: Olan Bering, MD   HISTORY OF PRESENT ILLNESS:  The patient is a 63 y.o. female  who I was asked to consult upon for leukopenia.  Labs in December 2024 showed a low white count of 1.8.  Her platelets were also low at 58.  Of note, CT scans in in 2022 showed cirrhosis and secondary splenomegaly.   The patient has known fatty liver disease; she has lost 10-15 pounds to try to help with this.  She denies being placed on any new medications recently that could be impacting her peripheral counts.  Of note, she has been seen by me in the past for the same hematologic issues.    PAST MEDICAL HISTORY:   Past Medical History:  Diagnosis Date   Diabetes mellitus without complication (HCC)    History of Bell's palsy    History of PSVT (paroxysmal supraventricular tachycardia)    History of renal calculi    Hyperlipidemia    Hypertension    Skin cancer    Splenomegaly     PAST SURGICAL HISTORY:   Past Surgical History:  Procedure Laterality Date   APPENDECTOMY     BREAST DUCTAL SYSTEM EXCISION     COLONOSCOPY     SQUAMOUS CELL CARCINOMA EXCISION     nose   TUBAL LIGATION Bilateral    UPPER GASTROINTESTINAL ENDOSCOPY      CURRENT MEDICATIONS:   Current Outpatient Medications  Medication Sig Dispense Refill   diazepam  (VALIUM ) 5 MG tablet Take pill by mouth 20-30 minutes prior to MRI. 1 tablet 0   gabapentin (NEURONTIN) 100 MG capsule At night     metoprolol succinate (TOPROL-XL) 25 MG 24 hr tablet Take 25 mg by mouth daily.     spironolactone (ALDACTONE) 25 MG tablet Take 12.5 mg by mouth daily.     telmisartan (MICARDIS) 80 MG tablet Take 80 mg by mouth daily.     traZODone (DESYREL) 50 MG tablet Take 50-100 mg by mouth at bedtime as needed.     metFORMIN (GLUCOPHAGE-XR) 500 MG 24 hr tablet Take 1,000 mg by mouth daily.  (Patient not taking: Reported on 03/16/2023)     SSD 1 % cream Apply 1 Application topically daily as needed. (Patient not taking: Reported on 02/19/2022)     No current facility-administered medications for this visit.    ALLERGIES:   Allergies  Allergen Reactions   Amlodipine    Benicar [Olmesartan]    Indapamide    Lisinopril     FAMILY HISTORY:   Family History  Problem Relation Age of Onset   Esophageal cancer Mother    Hypertension Mother    Congestive Heart Failure Mother    Chronic Renal Failure Mother    Congestive Heart Failure Father    Diabetes Father    Thyroid  disease Father    Leukemia Maternal Aunt    Crohn's disease Child    Leukemia Half-Brother    Down syndrome Half-Brother    Thyroid  disease Half-Sister    Colon cancer Neg Hx    Rectal cancer Neg Hx    Stomach cancer Neg Hx    Colon polyps Neg Hx     SOCIAL HISTORY:  The patient was born and raised in [place].  She currently lives there with her husband 45 years.  They have 3 children and 11 grandchildren.  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  She smokes a pack of cigarettes daily for 11 years before quitting 35 years ago.  There is no history of alcohol abuse.  REVIEW OF SYSTEMS:  Review of Systems  Constitutional:  Positive for fatigue. Negative for fever.  HENT:   Negative for hearing loss and sore throat.   Eyes:  Negative for eye problems.  Respiratory:  Negative for chest tightness, cough and hemoptysis.   Cardiovascular:  Positive for palpitations. Negative for chest pain.  Gastrointestinal:  Negative for abdominal distention, abdominal pain, blood in stool, constipation, diarrhea, nausea and vomiting.  Endocrine: Negative for hot flashes.  Genitourinary:  Negative for difficulty urinating, dysuria, frequency, hematuria and nocturia.   Musculoskeletal:  Negative for arthralgias, back pain, gait problem and myalgias.  Skin: Negative.  Negative for itching and rash.   Neurological: Negative.  Negative for dizziness, extremity weakness, gait problem, headaches, light-headedness and numbness.  Hematological: Negative.   Psychiatric/Behavioral: Negative.  Negative for depression and suicidal ideas. The patient is not nervous/anxious.     PHYSICAL EXAM:  Blood pressure (!) 159/73, pulse (!) 57, temperature 99 F (37.2 C), temperature source Oral, resp. rate 14, height 5\' 3"  (1.6 m), weight 187 lb 4.8 oz (85 kg), SpO2 95%. Wt Readings from Last 3 Encounters:  03/16/23 187 lb 4.8 oz (85 kg)  02/19/22 191 lb (86.6 kg)  01/22/22 191 lb (86.6 kg)   Body mass index is 33.18 kg/m. Performance status (ECOG): 1 - Symptomatic but completely ambulatory Physical Exam Constitutional:      Appearance: Normal appearance. She is not ill-appearing.  HENT:     Mouth/Throat:     Mouth: Mucous membranes are moist.     Pharynx: Oropharynx is clear. No oropharyngeal exudate or posterior oropharyngeal erythema.  Cardiovascular:     Rate and Rhythm: Normal rate and regular rhythm.     Heart sounds: No murmur heard.    No friction rub. No gallop.  Pulmonary:     Effort: Pulmonary effort is normal. No respiratory distress.     Breath sounds: Normal breath sounds. No wheezing, rhonchi or rales.  Abdominal:     General: Bowel sounds are normal. There is no distension.     Palpations: Abdomen is soft. There is no mass.     Tenderness: There is no abdominal tenderness.  Musculoskeletal:        General: No swelling.     Right lower leg: No edema.     Left lower leg: No edema.  Lymphadenopathy:     Cervical: No cervical adenopathy.     Upper Body:     Right upper body: No supraclavicular or axillary adenopathy.     Left upper body: No supraclavicular or axillary adenopathy.     Lower Body: No right inguinal adenopathy. No left inguinal adenopathy.  Skin:    General: Skin is warm.     Coloration: Skin is not jaundiced.     Findings: No lesion or rash.  Neurological:      General: No focal deficit present.     Mental Status: She is alert and oriented to person, place, and time. Mental status is at baseline.  Psychiatric:        Mood and Affect: Mood normal.        Behavior: Behavior normal.        Thought Content: Thought content normal.     LABS:      Latest Ref Rng & Units  03/16/2023    2:31 PM 10/16/2020   11:31 AM 08/12/2020   12:00 AM  CBC  WBC 4.0 - 10.5 K/uL 2.2  1.9 Repeated and verified X2.  2.4   Hemoglobin 12.0 - 15.0 g/dL 16.1  09.6  04.5   Hematocrit 36.0 - 46.0 % 42.9  41.4  41   Platelets 150 - 400 K/uL 45  48.0 Repeated and verified X2.  58       Latest Ref Rng & Units 03/16/2023    2:31 PM 10/16/2020   11:31 AM 08/12/2020   12:00 AM  CMP  Glucose 70 - 99 mg/dL 409  811    BUN 8 - 23 mg/dL 14  16  15    Creatinine 0.44 - 1.00 mg/dL 9.14  7.82  0.9   Sodium 135 - 145 mmol/L 139  139  139   Potassium 3.5 - 5.1 mmol/L 4.2  4.3  4.1   Chloride 98 - 111 mmol/L 103  107  106   CO2 22 - 32 mmol/L 26  24  27    Calcium 8.9 - 10.3 mg/dL 9.4  9.3  8.7   Total Protein 6.5 - 8.1 g/dL 7.1  7.0    Total Bilirubin 0.0 - 1.2 mg/dL 1.3  0.7    Alkaline Phos 38 - 126 U/L 106  80  94   AST 15 - 41 U/L 46  43  45   ALT 0 - 44 U/L 30  45  35     Latest Reference Range & Units 03/16/23 14:31  Iron 28 - 170 ug/dL 956 (H)  UIBC ug/dL 213  TIBC 086 - 578 ug/dL 469  Saturation Ratios 10.4 - 31.8 % 51 (H)  Ferritin 11 - 307 ng/mL 75  Folate >5.9 ng/mL 14.9  Vitamin B12 180 - 914 pg/mL 721  (H): Data is abnormally high  ASSESSMENT & PLAN:  A 63 y.o. female who I was asked to consult upon for leukopenia and thrombocytopenia.  Undoubtedly, these cytopenias are due to her severe liver disease and her cirrhosis.  Ultimately, the patient needs to be seen by hepatology as the health of her liver is what will determine her morbidity/mortality.  There really is no hematologic intervention that I can provide that will make her counts better.  I will turn  her care back over to her other physicians.  Our office would not have a problem providing her supportive care, with respect to a plateletpheresis, if it is needed in the future.  The patient understands all the plans discussed today and is in agreement with them.  I do appreciate Olan Bering, MD for his new consult.   Anaisabel Pederson Felicia Horde, MD

## 2023-03-16 ENCOUNTER — Encounter: Payer: Self-pay | Admitting: Oncology

## 2023-03-16 ENCOUNTER — Inpatient Hospital Stay: Payer: Self-pay | Attending: Oncology | Admitting: Oncology

## 2023-03-16 ENCOUNTER — Other Ambulatory Visit: Payer: Self-pay | Admitting: Oncology

## 2023-03-16 ENCOUNTER — Inpatient Hospital Stay: Payer: Self-pay

## 2023-03-16 VITALS — BP 159/73 | HR 57 | Temp 99.0°F | Resp 14 | Ht 63.0 in | Wt 187.3 lb

## 2023-03-16 DIAGNOSIS — D72819 Decreased white blood cell count, unspecified: Secondary | ICD-10-CM | POA: Insufficient documentation

## 2023-03-16 DIAGNOSIS — D708 Other neutropenia: Secondary | ICD-10-CM

## 2023-03-16 DIAGNOSIS — K7460 Unspecified cirrhosis of liver: Secondary | ICD-10-CM

## 2023-03-16 DIAGNOSIS — Z806 Family history of leukemia: Secondary | ICD-10-CM

## 2023-03-16 DIAGNOSIS — R161 Splenomegaly, not elsewhere classified: Secondary | ICD-10-CM

## 2023-03-16 DIAGNOSIS — K760 Fatty (change of) liver, not elsewhere classified: Secondary | ICD-10-CM

## 2023-03-16 DIAGNOSIS — D696 Thrombocytopenia, unspecified: Secondary | ICD-10-CM | POA: Insufficient documentation

## 2023-03-16 LAB — IRON AND TIBC
Iron: 174 ug/dL — ABNORMAL HIGH (ref 28–170)
Saturation Ratios: 51 % — ABNORMAL HIGH (ref 10.4–31.8)
TIBC: 340 ug/dL (ref 250–450)
UIBC: 166 ug/dL

## 2023-03-16 LAB — CBC WITH DIFFERENTIAL (CANCER CENTER ONLY)
Abs Immature Granulocytes: 0.02 10*3/uL (ref 0.00–0.07)
Basophils Absolute: 0 10*3/uL (ref 0.0–0.1)
Basophils Relative: 1 %
Eosinophils Absolute: 0 10*3/uL (ref 0.0–0.5)
Eosinophils Relative: 2 %
HCT: 42.9 % (ref 36.0–46.0)
Hemoglobin: 15 g/dL (ref 12.0–15.0)
Immature Granulocytes: 1 %
Lymphocytes Relative: 34 %
Lymphs Abs: 0.7 10*3/uL (ref 0.7–4.0)
MCH: 32.8 pg (ref 26.0–34.0)
MCHC: 35 g/dL (ref 30.0–36.0)
MCV: 93.7 fL (ref 80.0–100.0)
Monocytes Absolute: 0.2 10*3/uL (ref 0.1–1.0)
Monocytes Relative: 7 %
Neutro Abs: 1.3 10*3/uL — ABNORMAL LOW (ref 1.7–7.7)
Neutrophils Relative %: 55 %
Platelet Count: 45 10*3/uL — ABNORMAL LOW (ref 150–400)
RBC: 4.58 MIL/uL (ref 3.87–5.11)
RDW: 14.2 % (ref 11.5–15.5)
WBC Count: 2.2 10*3/uL — ABNORMAL LOW (ref 4.0–10.5)
nRBC: 0 % (ref 0.0–0.2)
nRBC: 0 /100{WBCs}

## 2023-03-16 LAB — FOLATE: Folate: 14.9 ng/mL (ref >5.9)

## 2023-03-16 LAB — CMP (CANCER CENTER ONLY)
ALT: 30 U/L (ref 0–44)
AST: 46 U/L — ABNORMAL HIGH (ref 15–41)
Albumin: 3.9 g/dL (ref 3.5–5.0)
Alkaline Phosphatase: 106 U/L (ref 38–126)
Anion gap: 10 (ref 5–15)
BUN: 14 mg/dL (ref 8–23)
CO2: 26 mmol/L (ref 22–32)
Calcium: 9.4 mg/dL (ref 8.9–10.3)
Chloride: 103 mmol/L (ref 98–111)
Creatinine: 1.07 mg/dL — ABNORMAL HIGH (ref 0.44–1.00)
GFR, Estimated: 58 mL/min — ABNORMAL LOW (ref >60)
Glucose, Bld: 171 mg/dL — ABNORMAL HIGH (ref 70–99)
Potassium: 4.2 mmol/L (ref 3.5–5.1)
Sodium: 139 mmol/L (ref 135–145)
Total Bilirubin: 1.3 mg/dL — ABNORMAL HIGH (ref 0.0–1.2)
Total Protein: 7.1 g/dL (ref 6.5–8.1)

## 2023-03-16 LAB — FERRITIN: Ferritin: 75 ng/mL (ref 11–307)

## 2023-03-16 LAB — VITAMIN B12: Vitamin B-12: 721 pg/mL (ref 180–914)

## 2023-03-17 ENCOUNTER — Telehealth: Payer: Self-pay | Admitting: Internal Medicine

## 2023-03-17 ENCOUNTER — Other Ambulatory Visit: Payer: Self-pay

## 2023-03-17 ENCOUNTER — Other Ambulatory Visit: Payer: Self-pay | Admitting: Internal Medicine

## 2023-03-17 DIAGNOSIS — K869 Disease of pancreas, unspecified: Secondary | ICD-10-CM

## 2023-03-17 NOTE — Telephone Encounter (Signed)
Inbound call from patient stating she is scheduled to have a MRI on Monday at [facility] and is wanting to know if she can get a pill to help with anxiety. Please advise.

## 2023-03-17 NOTE — Telephone Encounter (Signed)
Pt scheduled for MR/MRCP on Monday. Pt is requesting medication to help with anxiety for the MRI. Please advise.

## 2023-03-17 NOTE — Telephone Encounter (Signed)
If she has no allergies, or intolerance, described 5 mg of Valium p.o. 20 minutes before the exam. She needs to get someone to drive her, if she takes the Valium.

## 2023-03-18 ENCOUNTER — Other Ambulatory Visit: Payer: Self-pay

## 2023-03-18 MED ORDER — DIAZEPAM 5 MG PO TABS: ORAL_TABLET | ORAL | 0 refills | Status: AC

## 2023-03-18 NOTE — Telephone Encounter (Signed)
Pt aware and pill prescription called to her pharmacy. Her husband is her driver for the MRI.

## 2023-03-22 ENCOUNTER — Other Ambulatory Visit: Payer: Self-pay | Admitting: Internal Medicine

## 2023-03-22 ENCOUNTER — Ambulatory Visit (HOSPITAL_COMMUNITY)
Admission: RE | Admit: 2023-03-22 | Discharge: 2023-03-22 | Disposition: A | Discharge status: Home / Self Care | Payer: Self-pay | Source: Ambulatory Visit | Attending: Internal Medicine | Admitting: Internal Medicine

## 2023-03-22 DIAGNOSIS — K869 Disease of pancreas, unspecified: Secondary | ICD-10-CM | POA: Insufficient documentation

## 2023-03-22 MED ORDER — GADOBUTROL 1 MMOL/ML IV SOLN
8.0000 mL | Freq: Once | INTRAVENOUS | Status: AC | PRN
  Administered 2023-03-22: 8 mL via INTRAVENOUS

## 2023-04-07 ENCOUNTER — Encounter: Payer: Self-pay | Admitting: Internal Medicine
